# Patient Record
Sex: Male | Born: 1950 | Race: Black or African American | Hispanic: No | Marital: Single | Smoking: Never smoker
Health system: Southern US, Community
[De-identification: ages and names within clinical notes are randomized; demographics above are authoritative.]

## PROBLEM LIST (undated history)

## (undated) DIAGNOSIS — I10 Essential (primary) hypertension: Secondary | ICD-10-CM

## (undated) HISTORY — PX: FOOT SURGERY: SHX648

## (undated) HISTORY — DX: Essential (primary) hypertension: I10

---

## 2012-07-07 ENCOUNTER — Ambulatory Visit: Payer: 59 | Admitting: Emergency Medicine

## 2012-07-07 ENCOUNTER — Ambulatory Visit: Payer: 59

## 2012-07-07 VITALS — BP 165/96 | HR 69 | Temp 97.6°F | Resp 18 | Ht 70.0 in | Wt 203.0 lb

## 2012-07-07 DIAGNOSIS — M25579 Pain in unspecified ankle and joints of unspecified foot: Secondary | ICD-10-CM

## 2012-07-07 DIAGNOSIS — S92213A Displaced fracture of cuboid bone of unspecified foot, initial encounter for closed fracture: Secondary | ICD-10-CM

## 2012-07-07 MED ORDER — HYDROCODONE-ACETAMINOPHEN 5-500 MG PO TABS: 1.0000 | ORAL_TABLET | ORAL | Status: AC | PRN

## 2012-07-07 NOTE — Progress Notes (Signed)
[facility] [address] [place] [address] [phone]  Date:  07/07/2012   Name:  Gerald Fisher   DOB:  1951-06-06   MRN:  [id number]  PCP:  No primary provider on file.    Chief Complaint: Ankle Pain   History of Present Illness:  Gerald Fisher is a 61 y.o. very pleasant male patient who presents with the following:  Blowing leaves week before thanksgiving on an embankment and twisted his ankle.  Has persistent pain in lateral ankle and pain with weight bearing.  No deformity or ecchymosis.  Pain with inversion  There is no problem list on file for this patient.   History reviewed. No pertinent past medical history.  Past Surgical History  Procedure Date  . Foot surgery     History  Substance Use Topics  . Smoking status: Never Smoker   . Smokeless tobacco: Not on file  . Alcohol Use: Yes    History reviewed. No pertinent family history.  No Known Allergies  Medication list has been reviewed and updated.  No current outpatient prescriptions on file prior to visit.    Review of Systems:  As per HPI, otherwise negative.    Physical Examination: Filed Vitals:   07/07/12 1450  BP: 165/96  Pulse: 69  Temp: 97.6 F (36.4 C)  Resp: 18   Filed Vitals:   07/07/12 1450  Height: 5\' 10"  (1.778 m)  Weight: 203 lb (92.08 kg)   Body mass index is 29.13 kg/(m^2). Ideal Body Weight: Weight in (lb) to have BMI = 25: 173.9    GEN: WDWN, NAD, Non-toxic, Alert & Oriented x 3 HEENT: Atraumatic, Normocephalic.  Ears and Nose: No external deformity. EXTR: No clubbing/cyanosis/edema NEURO: Normal gait.  PSYCH: Normally interactive. Conversant. Not depressed or anxious appearing.  Calm demeanor.  Left ankle:  Tender swollen lateral ankle.  No deformity pain with inversion.  No ecchymosis.  Assessment and Plan: Cuboid fracture Boot Anaprox Elevate Ice Refer ortho  Carmelina Dane, MD  [facility] reading (PRIMARY) by  Dr.  Dareen Piano.  Ankle negative.  [facility] reading (PRIMARY) by  Dr. Dareen Piano.  Foot:  Fracture cuboid.

## 2012-07-08 ENCOUNTER — Other Ambulatory Visit: Payer: Self-pay | Admitting: Radiology

## 2012-07-11 NOTE — Progress Notes (Signed)
Reviewed and agree.

## 2012-12-03 ENCOUNTER — Ambulatory Visit: Payer: 59

## 2012-12-03 ENCOUNTER — Ambulatory Visit: Payer: 59 | Admitting: Internal Medicine

## 2012-12-03 VITALS — BP 142/84 | HR 58 | Temp 98.0°F | Resp 17 | Ht 69.0 in | Wt 203.0 lb

## 2012-12-03 DIAGNOSIS — M79673 Pain in unspecified foot: Secondary | ICD-10-CM

## 2012-12-03 DIAGNOSIS — M79609 Pain in unspecified limb: Secondary | ICD-10-CM

## 2012-12-03 DIAGNOSIS — M79675 Pain in left toe(s): Secondary | ICD-10-CM

## 2012-12-03 LAB — COMPREHENSIVE METABOLIC PANEL
CO2: 28 mEq/L (ref 19–32)
Calcium: 9.4 mg/dL (ref 8.4–10.5)
Chloride: 107 mEq/L (ref 96–112)
Glucose, Bld: 108 mg/dL — ABNORMAL HIGH (ref 70–99)
Sodium: 142 mEq/L (ref 135–145)
Total Bilirubin: 0.4 mg/dL (ref 0.3–1.2)
Total Protein: 7.2 g/dL (ref 6.0–8.3)

## 2012-12-03 LAB — POCT CBC
HCT, POC: 46.9 % (ref 43.5–53.7)
Lymph, poc: 2.3 (ref 0.6–3.4)
MCHC: 31.6 g/dL — AB (ref 31.8–35.4)
MCV: 98.2 fL — AB (ref 80–97)
POC LYMPH PERCENT: 41.6 %L (ref 10–50)
RDW, POC: 13.7 %

## 2012-12-03 MED ORDER — INDOMETHACIN 50 MG PO CAPS: 50.0000 mg | ORAL_CAPSULE | Freq: Three times a day (TID) | ORAL | Status: DC

## 2012-12-03 NOTE — Progress Notes (Signed)
  Subjective:    Patient ID: Gerald Fisher, male    DOB: 08-22-1950, 62 y.o.   MRN: [id number]  HPI Patient came in today for pain in both feet.  Patient states he is using ibuprofen for pain.  Patient's left foot is swollen.   Patient denies any injury.  Patient states he has had this in the past but when it gets well, he tends to go back to poor diet habbits.  Patient states that it has been painful for about a week. He calls it gout , uric acid from 2006 4.9. No hx of aspirated joint. No regular md or ckups..   Review of Systems     Objective:   Physical Exam  Constitutional: He is oriented to person, place, and time. He appears well-developed and well-nourished. No distress.  Eyes: EOM are normal.  Cardiovascular: Normal rate and normal heart sounds.   Pulmonary/Chest: Effort normal.  Musculoskeletal: He exhibits edema and tenderness.  Neurological: He is alert and oriented to person, place, and time. He has normal strength. No cranial nerve deficit or sensory deficit. He exhibits normal muscle tone. Coordination and gait abnormal.  Skin: Skin is warm. No rash noted. No erythema.      Results for orders placed in visit on 12/03/12  POCT CBC      Result Value Range   WBC 5.5  4.6 - 10.2 K/uL   Lymph, poc 2.3  0.6 - 3.4   POC LYMPH PERCENT 41.6  10 - 50 %L   MID (cbc) 0.7  0 - 0.9   POC MID % 12.0  0 - 12 %M   POC Granulocyte 2.6  2 - 6.9   Granulocyte percent 46.4  37 - 80 %G   RBC 4.78  4.69 - 6.13 M/uL   Hemoglobin 14.8  14.1 - 18.1 g/dL   HCT, POC 40.9  81.1 - 53.7 %   MCV 98.2 (*) 80 - 97 fL   MCH, POC 31.0  27 - 31.2 pg   MCHC 31.6 (*) 31.8 - 35.4 g/dL   RDW, POC 91.4     Platelet Count, POC 280  142 - 424 K/uL   MPV 8.5  0 - 99.8 fL   [facility] reading (PRIMARY) by  Dr.Guest no fx, punched out lesion at mtpj suggestive of gout         Assessment & Plan:  Possible gout

## 2012-12-03 NOTE — Patient Instructions (Signed)
Gout Gout is an inflammatory condition (arthritis) caused by a buildup of uric acid crystals in the joints. Uric acid is a chemical that is normally present in the blood. Under some circumstances, uric acid can form into crystals in your joints. This causes joint redness, soreness, and swelling (inflammation). Repeat attacks are common. Over time, uric acid crystals can form into masses (tophi) near a joint, causing disfigurement. Gout is treatable and often preventable. CAUSES  The disease begins with elevated levels of uric acid in the blood. Uric acid is produced by your body when it breaks down a naturally found substance called purines. This also happens when you eat certain foods such as meats and fish. Causes of an elevated uric acid level include:  Being passed down from parent to child (heredity).  Diseases that cause increased uric acid production (obesity, psoriasis, some cancers).  Excessive alcohol use.  Diet, especially diets rich in meat and seafood.  Medicines, including certain cancer-fighting drugs (chemotherapy), diuretics, and aspirin.  Chronic kidney disease. The kidneys are no longer able to remove uric acid well.  Problems with metabolism. Conditions strongly associated with gout include:  Obesity.  High blood pressure.  High cholesterol.  Diabetes. Not everyone with elevated uric acid levels gets gout. It is not understood why some people get gout and others do not. Surgery, joint injury, and eating too much of certain foods are some of the factors that can lead to gout. SYMPTOMS   An attack of gout comes on quickly. It causes intense pain with redness, swelling, and warmth in a joint.  Fever can occur.  Often, only one joint is involved. Certain joints are more commonly involved:  Base of the big toe.  Knee.  Ankle.  Wrist.  Finger. Without treatment, an attack usually goes away in a few days to weeks. Between attacks, you usually will not have  symptoms, which is different from many other forms of arthritis. DIAGNOSIS  Your caregiver will suspect gout based on your symptoms and exam. Removal of fluid from the joint (arthrocentesis) is done to check for uric acid crystals. Your caregiver will give you a medicine that numbs the area (local anesthetic) and use a needle to remove joint fluid for exam. Gout is confirmed when uric acid crystals are seen in joint fluid, using a special microscope. Sometimes, blood, urine, and X-ray tests are also used. TREATMENT  There are 2 phases to gout treatment: treating the sudden onset (acute) attack and preventing attacks (prophylaxis). Treatment of an Acute Attack  Medicines are used. These include anti-inflammatory medicines or steroid medicines.  An injection of steroid medicine into the affected joint is sometimes necessary.  The painful joint is rested. Movement can worsen the arthritis.  You may use warm or cold treatments on painful joints, depending which works best for you.  Discuss the use of coffee, vitamin C, or cherries with your caregiver. These may be helpful treatment options. Treatment to Prevent Attacks After the acute attack subsides, your caregiver may advise prophylactic medicine. These medicines either help your kidneys eliminate uric acid from your body or decrease your uric acid production. You may need to stay on these medicines for a very long time. The early phase of treatment with prophylactic medicine can be associated with an increase in acute gout attacks. For this reason, during the first few months of treatment, your caregiver may also advise you to take medicines usually used for acute gout treatment. Be sure you understand your caregiver's directions.   You should also discuss dietary treatment with your caregiver. Certain foods such as meats and fish can increase uric acid levels. Other foods such as dairy can decrease levels. Your caregiver can give you a list of foods  to avoid. HOME CARE INSTRUCTIONS   Do not take aspirin to relieve pain. This raises uric acid levels.  Only take over-the-counter or prescription medicines for pain, discomfort, or fever as directed by your caregiver.  Rest the joint as much as possible. When in bed, keep sheets and blankets off painful areas.  Keep the affected joint raised (elevated).  Use crutches if the painful joint is in your leg.  Drink enough water and fluids to keep your urine clear or pale yellow. This helps your body get rid of uric acid. Do not drink alcoholic beverages. They slow the passage of uric acid.  Follow your caregiver's dietary instructions. Pay careful attention to the amount of protein you eat. Your daily diet should emphasize fruits, vegetables, whole grains, and fat-free or low-fat milk products.  Maintain a healthy body weight. SEEK MEDICAL CARE IF:   You have an oral temperature above 102 F (38.9 C).  You develop diarrhea, vomiting, or any side effects from medicines.  You do not feel better in 24 hours, or you are getting worse. SEEK IMMEDIATE MEDICAL CARE IF:   Your joint becomes suddenly more tender and you have:  Chills.  An oral temperature above 102 F (38.9 C), not controlled by medicine. MAKE SURE YOU:   Understand these instructions.  Will watch your condition.  Will get help right away if you are not doing well or get worse. Document Released: 07/17/2000 Document Revised: 10/12/2011 Document Reviewed: 10/28/2009 ExitCare Patient Information 2013 [employer].    

## 2012-12-03 NOTE — Progress Notes (Signed)
  Subjective:    Patient ID: Gerald Fisher, male    DOB: [birth year], 62 y.o.   MRN: [id number]  HPI    Review of Systems     Objective:   Physical Exam        Assessment & Plan:

## 2013-11-06 IMAGING — CR DG FOOT COMPLETE 3+V*L*
3 series · 3 of 3 positions shown · non-contrast
Comparison: None.

CLINICAL DATA: Twisted ankle

LEFT FOOT - COMPLETE 3+ VIEW

[AP]
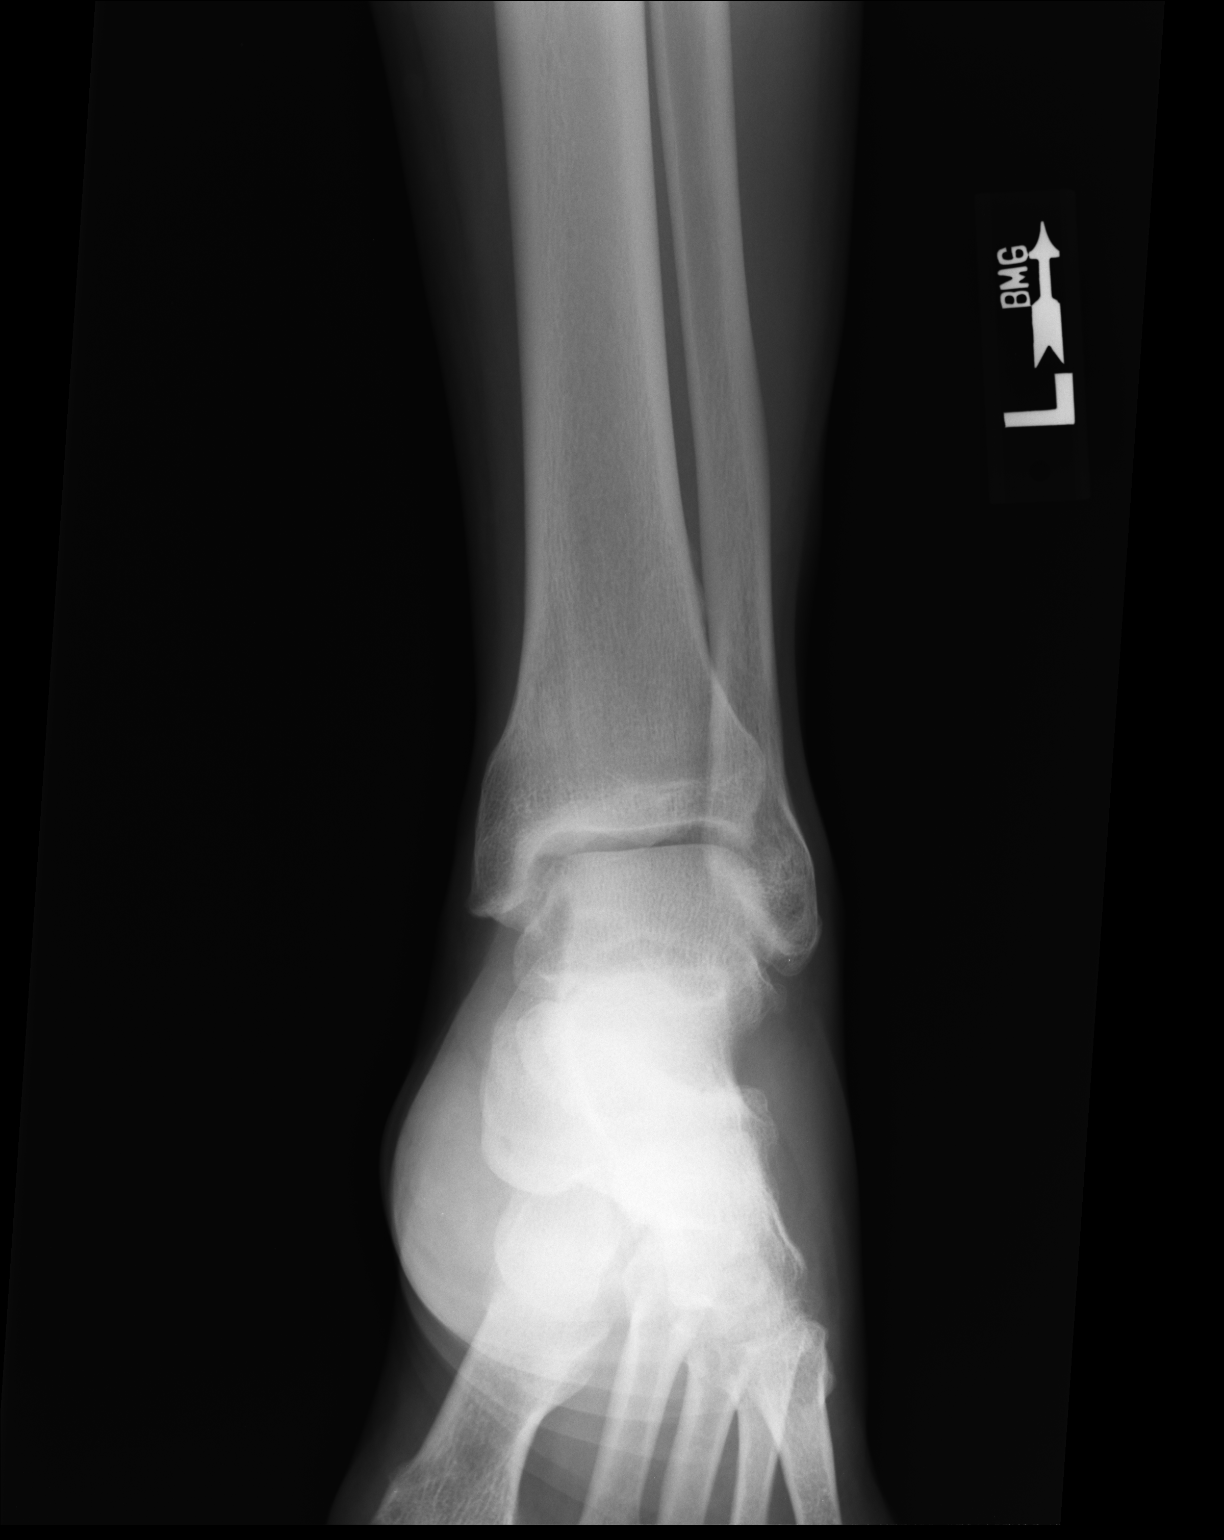

[ap obl int rot]
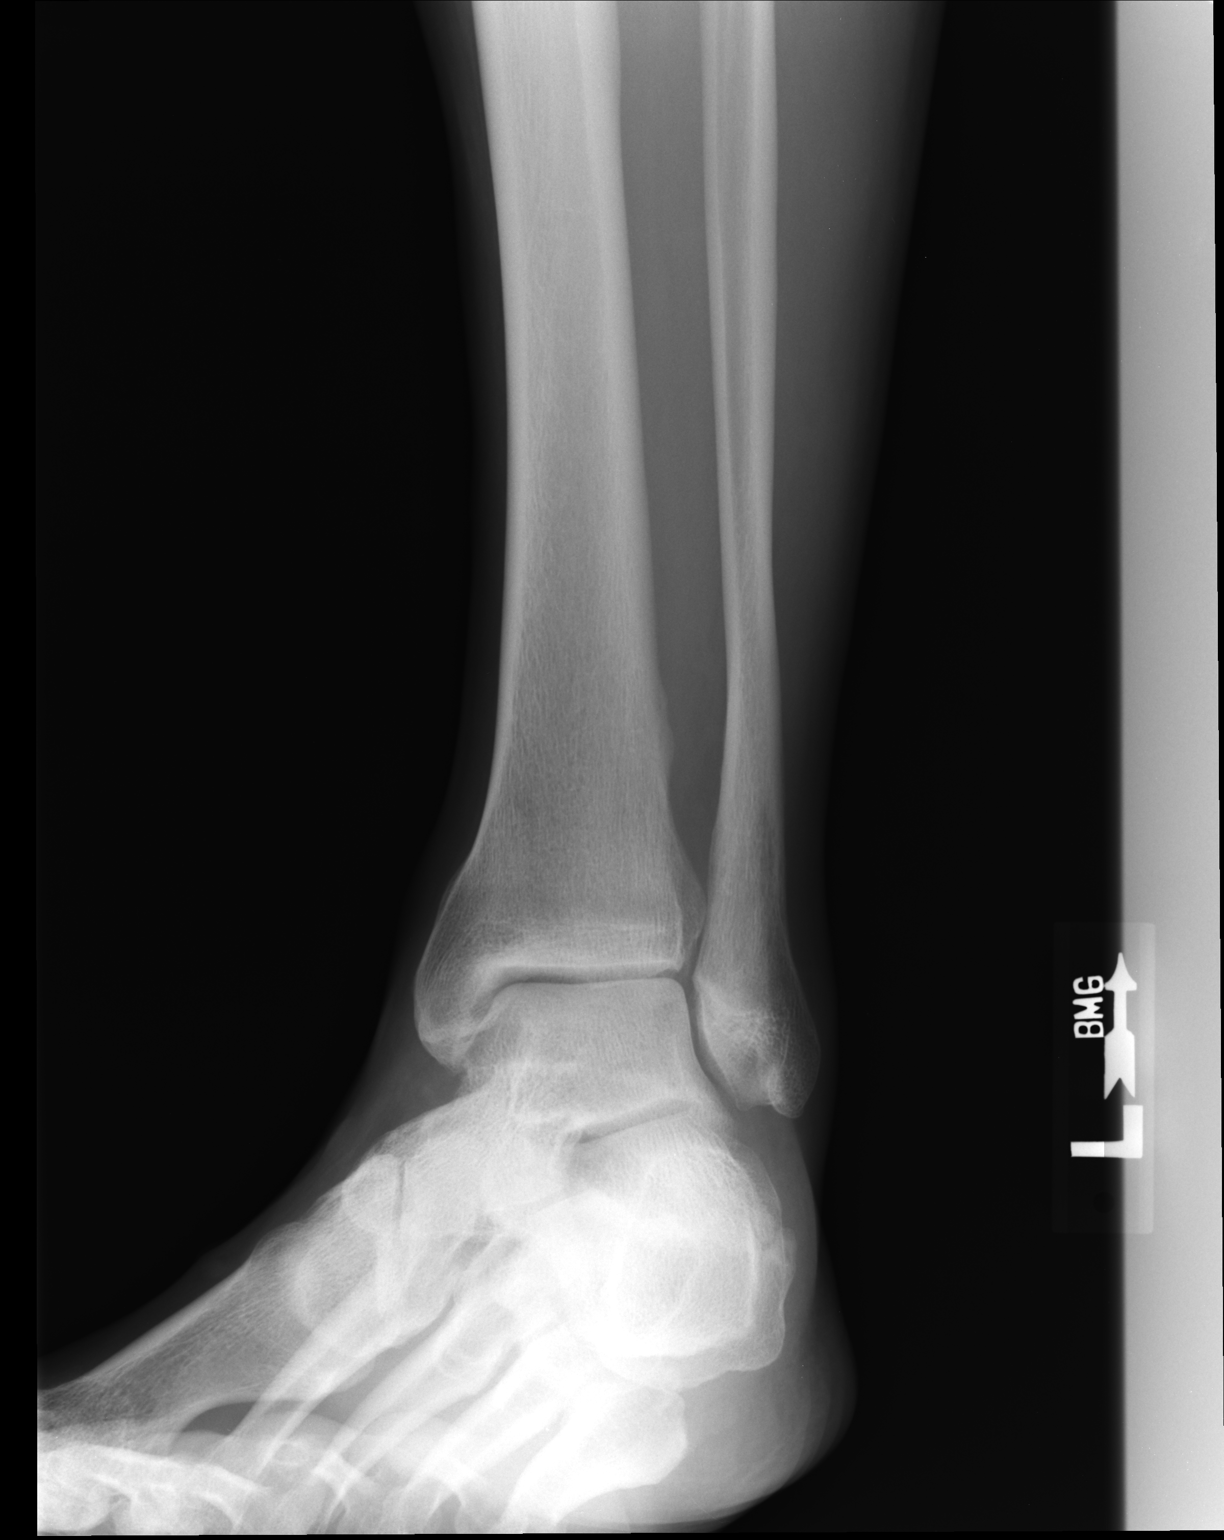

[lateral]
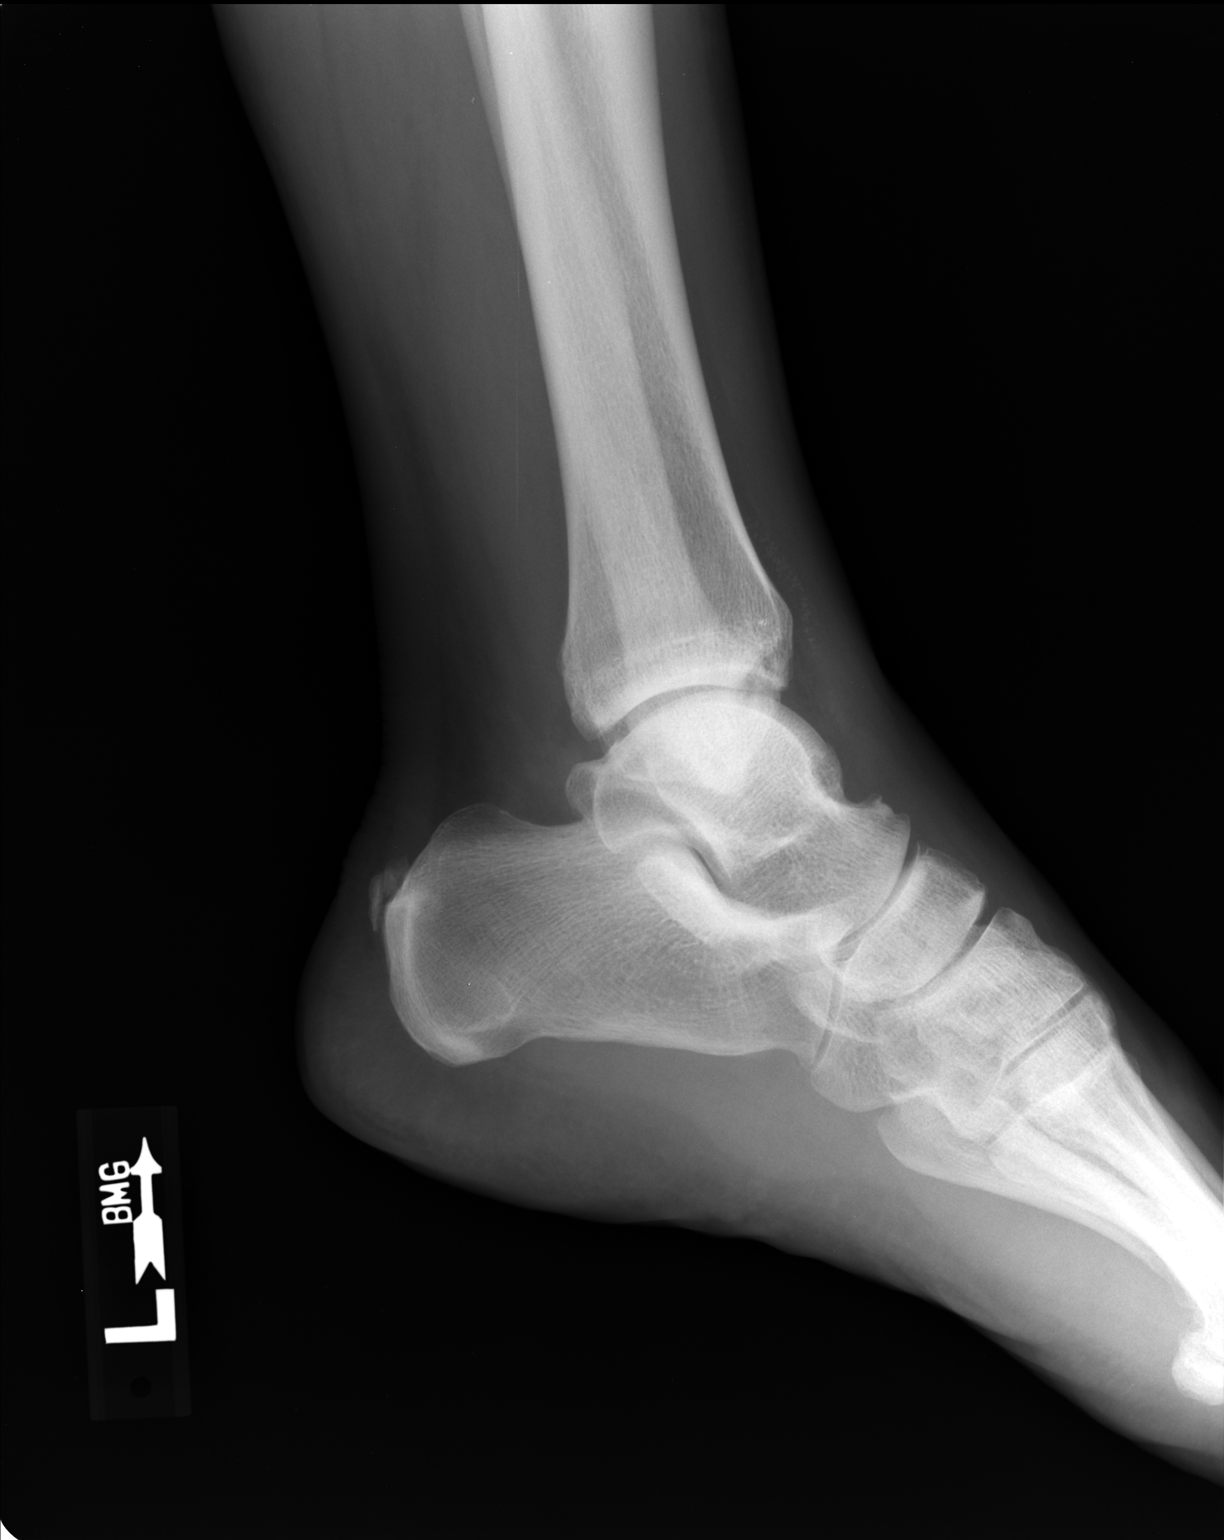

[3 of 3 positions shown; findings below may reference images not displayed]

FINDINGS: Negative for fracture.  Mild degenerative change in the
first metatarsal phalangeal joint.  No periosteal reaction or
erosion.  Spurring of the calcaneus.
IMPRESSION: Negative for fracture.

## 2014-04-04 IMAGING — CR DG TOE GREAT 2+V*L*
1 series · 1 of 1 positions shown · non-contrast
Comparison: 07/07/2012

CLINICAL DATA: Great toe pain and swelling

LEFT GREAT TOE

[AP]
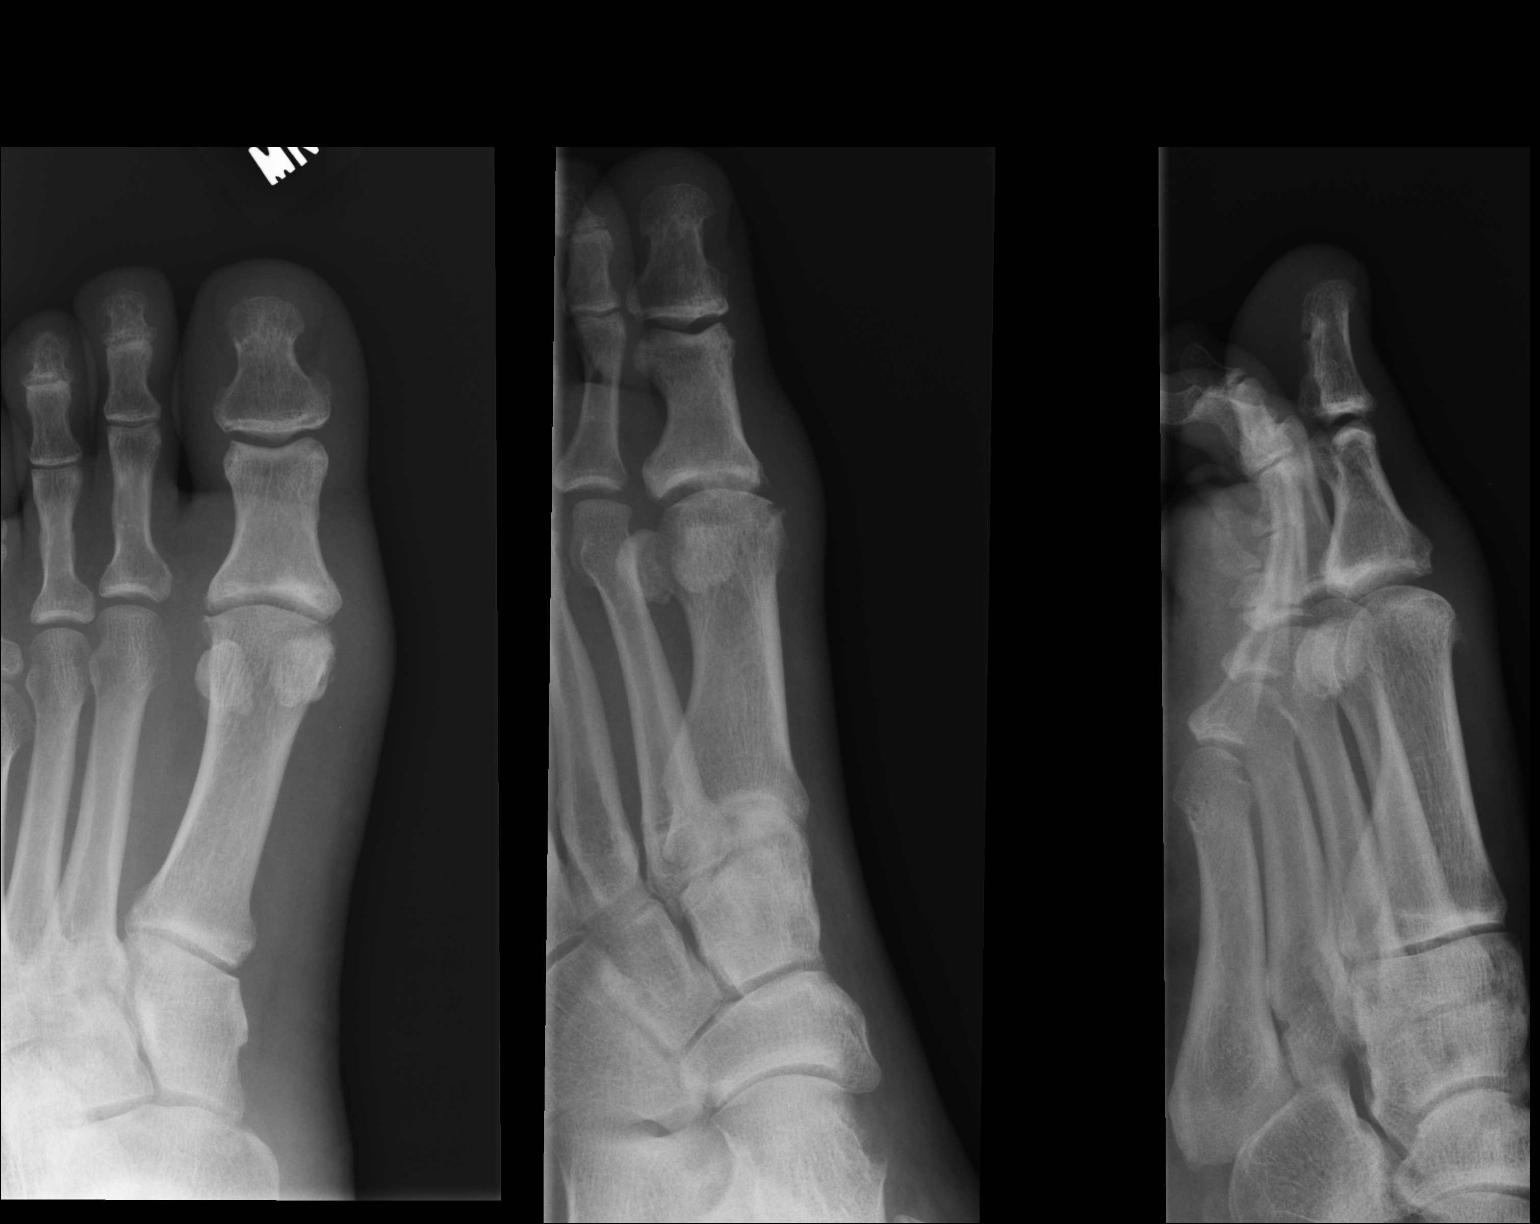

[1 of 1 positions shown; findings below may reference images not displayed]

FINDINGS: Normal alignment.  No fracture.  Left first MTP joint
degenerative change with sclerosis, bony spurring and possibly a
small early erosion of the left first metatarsal head.  This can be
seen with erosive osteoarthritis versus gout arthritis.  Overall
the appearance is similar to 07/07/2012
IMPRESSION: No acute osseous finding.

Left first MTP joint degenerative arthritis.

## 2015-05-24 ENCOUNTER — Ambulatory Visit (INDEPENDENT_AMBULATORY_CARE_PROVIDER_SITE_OTHER): Payer: MEDICARE | Admitting: Family Medicine

## 2015-05-24 VITALS — BP 160/90 | HR 73 | Temp 97.8°F | Resp 16 | Ht 69.0 in | Wt 198.8 lb

## 2015-05-24 DIAGNOSIS — M79675 Pain in left toe(s): Secondary | ICD-10-CM

## 2015-05-24 DIAGNOSIS — M79673 Pain in unspecified foot: Secondary | ICD-10-CM | POA: Diagnosis not present

## 2015-05-24 DIAGNOSIS — M10071 Idiopathic gout, right ankle and foot: Secondary | ICD-10-CM | POA: Insufficient documentation

## 2015-05-24 DIAGNOSIS — I10 Essential (primary) hypertension: Secondary | ICD-10-CM

## 2015-05-24 LAB — CBC
HCT: 43.1 % (ref 39.0–52.0)
Hemoglobin: 15.1 g/dL (ref 13.0–17.0)
MCH: 32.3 pg (ref 26.0–34.0)
MCHC: 35 g/dL (ref 30.0–36.0)
MCV: 92.1 fL (ref 78.0–100.0)
MPV: 10.5 fL (ref 8.6–12.4)
PLATELETS: 212 10*3/uL (ref 150–400)
RBC: 4.68 MIL/uL (ref 4.22–5.81)
RDW: 12.6 % (ref 11.5–15.5)
WBC: 4.4 10*3/uL (ref 4.0–10.5)

## 2015-05-24 LAB — COMPREHENSIVE METABOLIC PANEL
ALK PHOS: 63 U/L (ref 40–115)
ALT: 13 U/L (ref 9–46)
AST: 11 U/L (ref 10–35)
Albumin: 3.6 g/dL (ref 3.6–5.1)
BUN: 16 mg/dL (ref 7–25)
CO2: 28 mmol/L (ref 20–31)
CREATININE: 1.21 mg/dL (ref 0.70–1.25)
Calcium: 8.9 mg/dL (ref 8.6–10.3)
Chloride: 104 mmol/L (ref 98–110)
GLUCOSE: 86 mg/dL (ref 65–99)
POTASSIUM: 4 mmol/L (ref 3.5–5.3)
SODIUM: 139 mmol/L (ref 135–146)
TOTAL PROTEIN: 6.5 g/dL (ref 6.1–8.1)
Total Bilirubin: 0.6 mg/dL (ref 0.2–1.2)

## 2015-05-24 MED ORDER — COLCHICINE 0.6 MG PO TABS: ORAL_TABLET | ORAL | Status: DC

## 2015-05-24 MED ORDER — INDOMETHACIN 50 MG PO CAPS: 50.0000 mg | ORAL_CAPSULE | Freq: Three times a day (TID) | ORAL | Status: DC

## 2015-05-24 MED ORDER — LISINOPRIL 10 MG PO TABS: 10.0000 mg | ORAL_TABLET | Freq: Every day | ORAL | Status: DC

## 2015-05-24 NOTE — Patient Instructions (Addendum)
Use the indomethacin as needed for gout pain (while on this do not use ibuprofen or aleve) Also use the colchicine as directed- you can repeat this if needed for future gout flares.  Let us know if you do not feel better in the next 1-2 days Ice and elevation may also help  Your blood pressure has been high the last few times we have seen you here.   We are going to start lisinopril for your high blood pressure- I will be in touch with your labs Please come and see [place] in a couple of months for a full physical

## 2015-05-24 NOTE — Progress Notes (Signed)
[facility] [address], [place] [address] [phone]  Date:  05/24/2015   Name:  Gerald Fisher   DOB:  08-30-1950   MRN:  [id number]  PCP:  No primary care provider on file.    Chief Complaint: Foot Pain   History of Present Illness:  Gerald Fisher is a 64 y.o. very pleasant male patient who presents with the following:  He has a history of gout- noted a flare up in the right foot over the last 2-3 days.  This is the same joint where he has had gout in the past.  He has very occasional gout.  Last flare was a couple of years ago and responded to treatment  He has not had any injury to the foot  He is not taking indomethacin- this was an rx from long ago He has tried ibuprofen  Noted that his BP is running high- he has been aware of elevated BP in the past but has never been treated He does not generally see doctors or have check-ups He is ok with starting BP medication now There are no active problems to display for this patient.   History reviewed. No pertinent past medical history.  Past Surgical History  Procedure Laterality Date  . Foot surgery      Social History  Substance Use Topics  . Smoking status: Never Smoker   . Smokeless tobacco: None  . Alcohol Use: Yes    History reviewed. No pertinent family history.  No Known Allergies  Medication list has been reviewed and updated.  Current Outpatient Prescriptions on File Prior to Visit  Medication Sig Dispense Refill  . indomethacin (INDOCIN) 50 MG capsule Take 1 capsule (50 mg total) by mouth 3 (three) times daily with meals. (Patient not taking: Reported on 05/24/2015) 30 capsule 1   No current facility-administered medications on file prior to visit.    Review of Systems:  As per HPI- otherwise negative.  BP Readings from Last 3 Encounters:  05/24/15 162/88  12/03/12 142/84  07/07/12 165/96    Physical Examination: Filed Vitals:   05/24/15 1241  BP: 162/88   Pulse: 73  Temp: 97.8 F (36.6 C)  Resp: 16   Filed Vitals:   05/24/15 1241  Height: 5\' 9"  (1.753 m)  Weight: 198 lb 12.8 oz (90.175 kg)   Body mass index is 29.34 kg/(m^2). Ideal Body Weight: Weight in (lb) to have BMI = 25: 168.9  GEN: WDWN, NAD, Non-toxic, A & O x 3, mild overweight HEENT: Atraumatic, Normocephalic. Neck supple. No masses, No LAD. Ears and Nose: No external deformity. CV: RRR, No M/G/R. No JVD. No thrill. No extra heart sounds. PULM: CTA B, no wheezes, crackles, rhonchi. No retractions. No resp. distress. No accessory muscle use. EXTR: No c/c/e NEURO favoring right slightly, wearing a bedroom slipper on the right PSYCH: Normally interactive. Conversant. Not depressed or anxious appearing.  Calm demeanor.  Right foot; he has tenderness, mild redness and swelling at the right 1st MCP and 2nd MCP  Assessment and Plan: Acute idiopathic gout of right foot  Foot pain, unspecified laterality - Plan: indomethacin (INDOCIN) 50 MG capsule  Great toe pain, left - Plan: colchicine 0.6 MG tablet, indomethacin (INDOCIN) 50 MG capsule  Essential hypertension - Plan: CBC, Comprehensive metabolic panel, lisinopril (PRINIVIL,ZESTRIL) 10 MG tablet  Treat for gout with indomethacin and colchicine See patient instructions for more details.   Advised regarding follow-up care He has very  infrequent gout flares so prophylactic medication likely not needed Start on lisinopril 10 mg and await labs  Plan for follow-up and CPE in 1-2 months   Signed Lamar Blinks, MD

## 2015-05-25 ENCOUNTER — Encounter: Payer: Self-pay | Admitting: Family Medicine

## 2015-06-05 ENCOUNTER — Ambulatory Visit (INDEPENDENT_AMBULATORY_CARE_PROVIDER_SITE_OTHER): Payer: MEDICARE | Admitting: Family Medicine

## 2015-06-05 ENCOUNTER — Ambulatory Visit (INDEPENDENT_AMBULATORY_CARE_PROVIDER_SITE_OTHER): Payer: MEDICARE

## 2015-06-05 VITALS — BP 146/84 | HR 75 | Temp 98.3°F | Resp 16 | Ht 69.75 in | Wt 197.0 lb

## 2015-06-05 DIAGNOSIS — M1990 Unspecified osteoarthritis, unspecified site: Secondary | ICD-10-CM

## 2015-06-05 DIAGNOSIS — M1000 Idiopathic gout, unspecified site: Secondary | ICD-10-CM

## 2015-06-05 DIAGNOSIS — M19079 Primary osteoarthritis, unspecified ankle and foot: Secondary | ICD-10-CM

## 2015-06-05 DIAGNOSIS — M109 Gout, unspecified: Secondary | ICD-10-CM

## 2015-06-05 DIAGNOSIS — M10071 Idiopathic gout, right ankle and foot: Secondary | ICD-10-CM

## 2015-06-05 MED ORDER — PREDNISONE 10 MG PO TABS: ORAL_TABLET | ORAL | Status: DC

## 2015-06-05 NOTE — Patient Instructions (Signed)
Start the lisinopril for blood pressure once a day every day. Take the 6 day prednisone taper - do not take any other pain medicines other than tylenol (acetaminophen) while you are on the prednisone.  Take all the tabs together in the morning. After that, anytime your toe is hurting you, you can take an indomethacin - this works well for regular arthritis and for gout - we don't want you to stay on it all the time as it can cause heartburn, indigestion, acid stomach and can be hard on your kidneys, blood pressure, and heart. However, it is ok to take occasionally for pain or to use three times a day for just several days in times of more severe joint pains. If you have symptoms completely typical of gout - meaning when just 1 joint such as the toe swells up, becomes warm, red, and VERY tender (like your sock, shoe, or the weight of the bedding is to painful to your toe to tolerate), then go ahead and take 2 tabs of colchicine and another tab 1 hr later.  This should largely eliminate that gout flair and then for the next few days you can just stay on the indomethacin with breakfast, lunch, and dinner for 2-3 days until pain is completely gone and back to normal. We will want to recheck your kidneys and make sure your stomach is doing well in the next few months since starting the BP med so make an appointment for your welcome to medicare physical now!  Osteoarthritis Osteoarthritis is a disease that causes soreness and inflammation of a joint. It occurs when the cartilage at the affected joint wears down. Cartilage acts as a cushion, covering the ends of bones where they meet to form a joint. Osteoarthritis is the most common form of arthritis. It often occurs in older people. The joints affected most often by this condition include those in the:  Ends of the fingers.  Thumbs.  Neck.  Lower back.  Knees.  Hips. CAUSES  Over time, the cartilage that covers the ends of bones begins to wear away.  This causes bone to rub on bone, producing pain and stiffness in the affected joints.  RISK FACTORS Certain factors can increase your chances of having osteoarthritis, including:  Older age.  Excessive body weight.  Overuse of joints.  Previous joint injury. SIGNS AND SYMPTOMS   Pain, swelling, and stiffness in the joint.  Over time, the joint may lose its normal shape.  Small deposits of bone (osteophytes) may grow on the edges of the joint.  Bits of bone or cartilage can break off and float inside the joint space. This may cause more pain and damage. DIAGNOSIS  Your health care provider will do a physical exam and ask about your symptoms. Various tests may be ordered, such as:  X-rays of the affected joint.  Blood tests to rule out other types of arthritis. Additional tests may be used to diagnose your condition. TREATMENT  Goals of treatment are to control pain and improve joint function. Treatment plans may include:  A prescribed exercise program that allows for rest and joint relief.  A weight control plan.  Pain relief techniques, such as:  Properly applied heat and cold.  Electric pulses delivered to nerve endings under the skin (transcutaneous electrical nerve stimulation [TENS]).  Massage.  Certain nutritional supplements.  Medicines to control pain, such as:  Acetaminophen.  Nonsteroidal anti-inflammatory drugs (NSAIDs), such as naproxen.  Narcotic or central-acting agents, such as  tramadol.  Corticosteroids. These can be given orally or as an injection.  Surgery to reposition the bones and relieve pain (osteotomy) or to remove loose pieces of bone and cartilage. Joint replacement may be needed in advanced states of osteoarthritis. HOME CARE INSTRUCTIONS   Take medicines only as directed by your health care provider.  Maintain a healthy weight. Follow your health care provider's instructions for weight control. This may include dietary  instructions.  Exercise as directed. Your health care provider can recommend specific types of exercise. These may include:  Strengthening exercises. These are done to strengthen the muscles that support joints affected by arthritis. They can be performed with weights or with exercise bands to add resistance.  Aerobic activities. These are exercises, such as brisk walking or low-impact aerobics, that get your heart pumping.  Range-of-motion activities. These keep your joints limber.  Balance and agility exercises. These help you maintain daily living skills.  Rest your affected joints as directed by your health care provider.  Keep all follow-up visits as directed by your health care provider. SEEK MEDICAL CARE IF:   Your skin turns red.  You develop a rash in addition to your joint pain.  You have worsening joint pain.  You have a fever along with joint or muscle aches. SEEK IMMEDIATE MEDICAL CARE IF:  You have a significant loss of weight or appetite.  You have night sweats. [employer] of Arthritis and Musculoskeletal and Skin Diseases: www.niams.SouthExposed.es  [employer] on Aging: http://kim-miller.com/  American College of Rheumatology: www.rheumatology.org   This information is not intended to replace advice given to you by your health care provider. Make sure you discuss any questions you have with your health care provider.   Document Released: 07/20/2005 Document Revised: 08/10/2014 Document Reviewed: 03/27/2013 Elsevier Interactive Patient Education 2016 [employer].  There are some easy diet adjustments that you can make to lower your blood uric acid level and thereby hopefully never have to suffer from another gout flair (or at least less frequently).  You should avoid alcohol, drink plenty of water, and try to follow a "low purine" diet as your body produces uric acid when it breaks down prurines-substances that are found naturally in  your body, as well as in certain foods such as organ meats, anchioves, herring, asparagus, and mushrooms. Also, increasing your diet in certain foods that may lower uric acid levels is a pretty safe way to decrease your likelihood of gout so consider drinking coffee (regular and/or decaf), eating fruits with Vitamin C in them such as citrus fruits, strawberries, broccoli,  brussel sprouts, papaya, and cantaloupe (though megadoses of vitamin C supplements may do the opposite and increase your body's uric acid levels), and/or eating more cherries and other dark-colored fruits, such as blackberries, blueberries, purple grapes and raspberries. In addition, getting plenty of vitamin A though yellow fruits, or dark green/yellow vegetables at least every other day is good.  Other general diet guidelines for people with gout who need to lower their blood uric acid levels are as follows:  Drink 8 to 16 cups ( about 2 to 4 liters) of fluid each day, with at least half being water. Eat a moderate amount of protein, preferably from healthy sources, such as low-fat or fat-free dairy, tofu, eggs, and nut butters. Limit your daily intake of meat, fish, and poultry to 4 to 6 ounces. Avoid high fat meats and desserts. Decrease your intake of shellfish, beef, lamb, pork, eggs  and cheese. Avoid drastic weight reduction or fasting.  If weight loss is desired lose it over a period of several months.

## 2015-06-05 NOTE — Progress Notes (Addendum)
Subjective:    Patient ID: Gerald Fisher, male    DOB: 09-15-1950, 64 y.o.   MRN: [id number] This chart was scribed for Delman Cheadle, MD by Zola Button, Medical Scribe. This patient was seen in Room 5 and the patient's care was started at 4:47 PM.   Chief Complaint  Patient presents with  . Gout    right foot    HPI HPI Comments: Gerald Fisher is a 64 y.o. male who presents to the [facility] for a follow-up for gout. He was seen 2 weeks ago by Dr. Lorelei Pont. Started patient on indomethacin for right foot gout as well as colchicine. He also started lisinopril for blood pressure. CBC and CMP were normal. Blood pressure significantly improved. Today, patient notes that the swelling had improved significantly with the medications, but notes that the his symptoms returned 3 days ago after he had been more physically active. He had been taking all of his medications, including the indomethacin 3 times a day, but discontinued all of his medications because he began having diarrhea and was unsure which medication was causing the diarrhea. He then restarted the colchicine again but it did not seem to be effective. Patient had a foot surgery done on his right 1st toe over 10 years ago, which was done locally.  History reviewed. No pertinent past medical history. Past Surgical History  Procedure Laterality Date  . Foot surgery     Current Outpatient Prescriptions on File Prior to Visit  Medication Sig Dispense Refill  . colchicine 0.6 MG tablet Take 2 pills once and then 1 pill an hour later. Repeat as needed for gout flare (Patient not taking: Reported on 06/15/2015) 30 tablet 0  . indomethacin (INDOCIN) 50 MG capsule Take 1 capsule (50 mg total) by mouth 3 (three) times daily with meals. (Patient not taking: Reported on 06/15/2015) 30 capsule 1  . lisinopril (PRINIVIL,ZESTRIL) 10 MG tablet Take 1 tablet (10 mg total) by mouth daily. 90 tablet 3   No current facility-administered  medications on file prior to visit.   No Known Allergies History reviewed. No pertinent family history. Social History   Social History  . Marital Status: Single    Spouse Name: N/A  . Number of Children: N/A  . Years of Education: N/A   Social History Main Topics  . Smoking status: Never Smoker   . Smokeless tobacco: None  . Alcohol Use: Yes  . Drug Use: No  . Sexual Activity: Yes    Birth Control/ Protection: Condom   Other Topics Concern  . None   Social History Narrative    Review of Systems  Constitutional: Positive for activity change. Negative for fever, chills, diaphoresis, appetite change and unexpected weight change.  Eyes: Negative for visual disturbance.  Respiratory: Negative for shortness of breath.   Cardiovascular: Positive for leg swelling.  Gastrointestinal: Positive for diarrhea. Negative for nausea, vomiting and abdominal pain.  Musculoskeletal: Positive for myalgias, joint swelling, arthralgias and gait problem.  Skin: Positive for color change. Negative for pallor and wound.  Neurological: Negative for weakness, numbness and headaches.  Hematological: Does not bruise/bleed easily.  Psychiatric/Behavioral: Positive for sleep disturbance.       Objective:  BP 146/84 mmHg  Pulse 75  Temp(Src) 98.3 F (36.8 C) (Oral)  Resp 16  Ht 5' 9.75" (1.772 m)  Wt 197 lb (89.359 kg)  BMI 28.46 kg/m2  SpO2 99%  Physical Exam  Constitutional: He is oriented to  person, place, and time. He appears well-developed and well-nourished. No distress.  HENT:  Head: Normocephalic and atraumatic.  Mouth/Throat: Oropharynx is clear and moist. No oropharyngeal exudate.  Eyes: Pupils are equal, round, and reactive to light.  Neck: Neck supple.  Cardiovascular: Normal rate.   Pulses:      Dorsalis pedis pulses are 2+ on the right side.       Posterior tibial pulses are 2+ on the right side.  Pulmonary/Chest: Effort normal.  Musculoskeletal: He exhibits no edema.    No lower extremity edema.  Neurological: He is alert and oriented to person, place, and time. No cranial nerve deficit.  Skin: Skin is warm and dry. No rash noted. No erythema.  No warmth. Hyperpigmentation over the dorsal aspect of the right 1st MTP with 1+ non-pitting edema around MTP.  Psychiatric: He has a normal mood and affect. His behavior is normal.  Nursing note and vitals reviewed.    [facility] reading (PRIMARY) by  Dr. Brigitte Pulse. Right foot: prior surgical pins seem to be in appropriate location. 1st MTP arthritis Assessment & Plan:   1. Acute idiopathic gout of right foot   2. Podagra   3. Arthritis of first MTP joint     Orders Placed This Encounter  Procedures  . DG Foot 2 Views Right    Standing Status: Future     Number of Occurrences: 1     Standing Expiration Date: 06/04/2016    Order Specific Question:  Reason for Exam (SYMPTOM  OR DIAGNOSIS REQUIRED)    Answer:  first MTP - s/p surgery    Order Specific Question:  Preferred imaging location?    Answer:  External    Meds ordered this encounter  Medications  .  predniSONE (DELTASONE) 10 MG tablet    Sig: 6-5-4-3-2-1 tabs po qd    Dispense:  21 tablet    Refill:  0    I personally performed the services described in this documentation, which was scribed in my presence. The recorded information has been reviewed and considered, and addended by me as needed.  Delman Cheadle, MD MPH   By signing my name below, I, Zola Button, attest that this documentation has been prepared under the direction and in the presence of Delman Cheadle, MD.  Electronically Signed: Zola Button, Medical Scribe. 06/05/2015. 4:52 PM.

## 2015-06-14 ENCOUNTER — Other Ambulatory Visit: Payer: Self-pay | Admitting: Family Medicine

## 2015-06-15 ENCOUNTER — Ambulatory Visit (INDEPENDENT_AMBULATORY_CARE_PROVIDER_SITE_OTHER): Payer: MEDICARE | Admitting: Family Medicine

## 2015-06-15 VITALS — BP 140/88 | HR 63 | Temp 97.5°F | Resp 16 | Ht 69.75 in | Wt 198.2 lb

## 2015-06-15 DIAGNOSIS — M10071 Idiopathic gout, right ankle and foot: Secondary | ICD-10-CM | POA: Diagnosis not present

## 2015-06-15 LAB — SEDIMENTATION RATE: SED RATE: 8 mm/h (ref 0–20)

## 2015-06-15 LAB — URIC ACID: Uric Acid, Serum: 6.1 mg/dL (ref 4.0–7.8)

## 2015-06-15 MED ORDER — ALLOPURINOL 300 MG PO TABS: 300.0000 mg | ORAL_TABLET | Freq: Every day | ORAL | Status: DC

## 2015-06-15 MED ORDER — PREDNISONE 20 MG PO TABS: ORAL_TABLET | ORAL | Status: DC

## 2015-06-15 NOTE — Progress Notes (Signed)
Subjective:  This chart was scribed for Delman Cheadle, MD by [facility], medical scribe at [facility].The patient was seen in exam room 07 and the patient's care was started at 11:44 AM.    Patient ID: Gerald Fisher, male    DOB: 1950/11/10, 64 y.o.   MRN: [id number] Chief Complaint  Patient presents with  . Follow-up    gout   HPI HPI Comments: Gerald Fisher is a 64 y.o. male who presents to [facility] for a gout flare up. Diagnosed with gout and prescribed indomethacin and colchicine. He was confused on how to use them. Also had prior hx of surgery on his first MTP and unaware he had pins still in place. X-ray was done and suspected with was a flare of osteoarthritis instead of gout so he was placed on a six day prednisone taper.  He did well on the prednisone taper, however his right foot began flaring up 2-3 days ago after he got off the prednisone. No major changes in his diet this past week.  History reviewed. No pertinent past medical history. Current Outpatient Prescriptions on File Prior to Visit  Medication Sig Dispense Refill  . lisinopril (PRINIVIL,ZESTRIL) 10 MG tablet Take 1 tablet (10 mg total) by mouth daily. 90 tablet 3  . colchicine 0.6 MG tablet Take 2 pills once and then 1 pill an hour later. Repeat as needed for gout flare (Patient not taking: Reported on 06/15/2015) 30 tablet 0  . indomethacin (INDOCIN) 50 MG capsule Take 1 capsule (50 mg total) by mouth 3 (three) times daily with meals. (Patient not taking: Reported on 06/15/2015) 30 capsule 1  . predniSONE (DELTASONE) 10 MG tablet 6-5-4-3-2-1 tabs po qd (Patient not taking: Reported on 06/15/2015) 21 tablet 0   No current facility-administered medications on file prior to visit.   No Known Allergies   Review of Systems  Constitutional: Negative for fever, chills, diaphoresis and appetite change.  Cardiovascular: Positive for leg swelling.  Gastrointestinal: Negative  for abdominal pain.  Musculoskeletal: Positive for myalgias, joint swelling, arthralgias and gait problem.  Skin: Positive for color change.  Psychiatric/Behavioral: Positive for sleep disturbance.      Objective:  BP 140/88 mmHg  Pulse 63  Temp(Src) 97.5 F (36.4 C) (Oral)  Resp 16  Ht 5' 9.75" (1.772 m)  Wt 198 lb 3.2 oz (89.903 kg)  BMI 28.63 kg/m2  SpO2 99% Physical Exam  Constitutional: He is oriented to person, place, and time. He appears well-developed and well-nourished. No distress.  HENT:  Head: Normocephalic and atraumatic.  Eyes: Pupils are equal, round, and reactive to light.  Neck: Normal range of motion.  Cardiovascular: Normal rate and regular rhythm.   Pulmonary/Chest: Effort normal. No respiratory distress.  Musculoskeletal: Normal range of motion.  Swelling over right first MTP. More so at the proximal aspect over the distal metatarsals than the phalanx with mild warmth, erythema and severe tenderness.  Neurological: He is alert and oriented to person, place, and time.  Skin: Skin is warm and dry.  Psychiatric: He has a normal mood and affect. His behavior is normal.  Nursing note and vitals reviewed.      Assessment & Plan:   1. Acute idiopathic gout of right foot     Orders Placed This Encounter  Procedures  . Uric Acid  . Sedimentation rate    Meds ordered this encounter  Medications  . predniSONE (DELTASONE) 20 MG tablet  Sig: 3 tabs po qd x3d, 2 tabs po qd x 3d, 1 tab po qd x 3d    Dispense:  18 tablet    Refill:  0  . allopurinol (ZYLOPRIM) 300 MG tablet    Sig: Take 1 tablet (300 mg total) by mouth daily.    Dispense:  90 tablet    Refill:  1    I personally performed the services described in this documentation, which was scribed in my presence. The recorded information has been reviewed and considered, and addended by me as needed.  Delman Cheadle, MD MPH    By signing my name below, I, Nadim Abuhashem, attest that this  documentation has been prepared under the direction and in the presence of Delman Cheadle, MD.  Electronically Signed: Lora Havens, medical scribe. 06/15/2015, 11:55 AM.

## 2015-06-15 NOTE — Patient Instructions (Addendum)
Start the prednisone course today - it is a stronger, longer course.  Take the pills altogether in the morning. In 1 week, start the allopurinol and take it daily with your lisinopril. You can use the indomethacin whenever you develop pain in your toe going forward but should not need for the next week while you are on the prednisone.  If you start to develop just a little pian or tingling  Gout Gout is an inflammatory arthritis caused by a buildup of uric acid crystals in the joints. Uric acid is a chemical that is normally present in the blood. When the level of uric acid in the blood is too high it can form crystals that deposit in your joints and tissues. This causes joint redness, soreness, and swelling (inflammation). Repeat attacks are common. Over time, uric acid crystals can form into masses (tophi) near a joint, destroying bone and causing disfigurement. Gout is treatable and often preventable. CAUSES  The disease begins with elevated levels of uric acid in the blood. Uric acid is produced by your body when it breaks down a naturally found substance called purines. Certain foods you eat, such as meats and fish, contain high amounts of purines. Causes of an elevated uric acid level include:  Being passed down from parent to child (heredity).  Diseases that cause increased uric acid production (such as obesity, psoriasis, and certain cancers).  Excessive alcohol use.  Diet, especially diets rich in meat and seafood.  Medicines, including certain cancer-fighting medicines (chemotherapy), water pills (diuretics), and aspirin.  Chronic kidney disease. The kidneys are no longer able to remove uric acid well.  Problems with metabolism. Conditions strongly associated with gout include:  Obesity.  High blood pressure.  High cholesterol.  Diabetes. Not everyone with elevated uric acid levels gets gout. It is not understood why some people get gout and others do not. Surgery, joint  injury, and eating too much of certain foods are some of the factors that can lead to gout attacks. SYMPTOMS   An attack of gout comes on quickly. It causes intense pain with redness, swelling, and warmth in a joint.  Fever can occur.  Often, only one joint is involved. Certain joints are more commonly involved:  Base of the big toe.  Knee.  Ankle.  Wrist.  Finger. Without treatment, an attack usually goes away in a few days to weeks. Between attacks, you usually will not have symptoms, which is different from many other forms of arthritis. DIAGNOSIS  Your caregiver will suspect gout based on your symptoms and exam. In some cases, tests may be recommended. The tests may include:  Blood tests.  Urine tests.  X-rays.  Joint fluid exam. This exam requires a needle to remove fluid from the joint (arthrocentesis). Using a microscope, gout is confirmed when uric acid crystals are seen in the joint fluid. TREATMENT  There are two phases to gout treatment: treating the sudden onset (acute) attack and preventing attacks (prophylaxis).  Treatment of an Acute Attack.  Medicines are used. These include anti-inflammatory medicines or steroid medicines.  An injection of steroid medicine into the affected joint is sometimes necessary.  The painful joint is rested. Movement can worsen the arthritis.  You may use warm or cold treatments on painful joints, depending which works best for you.  Treatment to Prevent Attacks.  If you suffer from frequent gout attacks, your caregiver may advise preventive medicine. These medicines are started after the acute attack subsides. These medicines either help  your kidneys eliminate uric acid from your body or decrease your uric acid production. You may need to stay on these medicines for a very long time.  The early phase of treatment with preventive medicine can be associated with an increase in acute gout attacks. For this reason, during the first  few months of treatment, your caregiver may also advise you to take medicines usually used for acute gout treatment. Be sure you understand your caregiver's directions. Your caregiver may make several adjustments to your medicine dose before these medicines are effective.  Discuss dietary treatment with your caregiver or dietitian. Alcohol and drinks high in sugar and fructose and foods such as meat, poultry, and seafood can increase uric acid levels. Your caregiver or dietitian can advise you on drinks and foods that should be limited. HOME CARE INSTRUCTIONS   Do not take aspirin to relieve pain. This raises uric acid levels.  Only take over-the-counter or prescription medicines for pain, discomfort, or fever as directed by your caregiver.  Rest the joint as much as possible. When in bed, keep sheets and blankets off painful areas.  Keep the affected joint raised (elevated).  Apply warm or cold treatments to painful joints. Use of warm or cold treatments depends on which works best for you.  Use crutches if the painful joint is in your leg.  Drink enough fluids to keep your urine clear or pale yellow. This helps your body get rid of uric acid. Limit alcohol, sugary drinks, and fructose drinks.  Follow your dietary instructions. Pay careful attention to the amount of protein you eat. Your daily diet should emphasize fruits, vegetables, whole grains, and fat-free or low-fat milk products. Discuss the use of coffee, vitamin C, and cherries with your caregiver or dietitian. These may be helpful in lowering uric acid levels.  Maintain a healthy body weight. SEEK MEDICAL CARE IF:   You develop diarrhea, vomiting, or any side effects from medicines.  You do not feel better in 24 hours, or you are getting worse. SEEK IMMEDIATE MEDICAL CARE IF:   Your joint becomes suddenly more tender, and you have chills or a fever. MAKE SURE YOU:   Understand these instructions.  Will watch your  condition.  Will get help right away if you are not doing well or get worse.   This information is not intended to replace advice given to you by your health care provider. Make sure you discuss any questions you have with your health care provider.   Document Released: 07/17/2000 Document Revised: 08/10/2014 Document Reviewed: 03/02/2012 Elsevier Interactive Patient Education [employer].

## 2015-06-17 ENCOUNTER — Encounter: Payer: Self-pay | Admitting: Family Medicine

## 2015-06-18 ENCOUNTER — Other Ambulatory Visit: Payer: Self-pay | Admitting: Family Medicine

## 2015-06-30 ENCOUNTER — Ambulatory Visit (INDEPENDENT_AMBULATORY_CARE_PROVIDER_SITE_OTHER): Payer: MEDICARE | Admitting: Emergency Medicine

## 2015-06-30 VITALS — BP 132/84 | HR 77 | Temp 97.9°F | Resp 16 | Ht 69.0 in | Wt 198.0 lb

## 2015-06-30 DIAGNOSIS — Z967 Presence of other bone and tendon implants: Secondary | ICD-10-CM | POA: Diagnosis not present

## 2015-06-30 DIAGNOSIS — M79671 Pain in right foot: Secondary | ICD-10-CM | POA: Diagnosis not present

## 2015-06-30 DIAGNOSIS — M10071 Idiopathic gout, right ankle and foot: Secondary | ICD-10-CM

## 2015-06-30 LAB — POCT CBC
Granulocyte percent: 61.5 %G (ref 37–80)
HCT, POC: 45.4 % (ref 43.5–53.7)
HEMOGLOBIN: 15.8 g/dL (ref 14.1–18.1)
Lymph, poc: 1.7 (ref 0.6–3.4)
MCH: 32.5 pg — AB (ref 27–31.2)
MCHC: 34.8 g/dL (ref 31.8–35.4)
MCV: 93.3 fL (ref 80–97)
MID (cbc): 0.2 (ref 0–0.9)
MPV: 6.8 fL (ref 0–99.8)
POC Granulocyte: 3.1 (ref 2–6.9)
POC LYMPH PERCENT: 34.5 %L (ref 10–50)
POC MID %: 4 % (ref 0–12)
Platelet Count, POC: 170 10*3/uL (ref 142–424)
RBC: 4.86 M/uL (ref 4.69–6.13)
RDW, POC: 13.6 %
WBC: 5 10*3/uL (ref 4.6–10.2)

## 2015-06-30 MED ORDER — PREDNISONE 10 MG PO TABS: ORAL_TABLET | ORAL | Status: DC

## 2015-06-30 NOTE — Progress Notes (Signed)
[facility] [address], [place] [address] [phone]  Date:  06/30/2015   Name:  Gerald Fisher   DOB:  27-Aug-1950   MRN:  [id number]  PCP:  No primary care provider on file.    Chief Complaint: Follow-up   History of Present Illness:  This is a 64 y.o. male with PMH HTN and gout who is presenting with goat flare in right 1st MTP. Gout flare started middle of October. He was seen here by Dr. Lorelei Pont on 10/21. He was started on indocin and colchicine. He returned 10 days later on 11/2 stating the meds had helped some but as soon as he became more physically active his symptoms returned. He saw Dr. Brigitte Pulse at that time. He was rx'd a prednisone taper. His symptoms resolved on the prednisone taper but 2-3 days after his symptoms returned. He again came back and saw Dr. Brigitte Pulse who put him on a more extended prednisone taper. He was instructed to start allopurinol for prevention once he finished the taper. He finished the taper 6 days ago. He had marked improvement in symptoms but still had some mild soreness. He started the allopurinol that day and has taken daily since. 2 days ago his symptoms flared again. He is having redness, swelling and pain of right 1st MTP. Gait is impaired but he is able to bear weight. He tried indomethacin past few days but wasn't helping his pain. He has not had any shellfish or alcohol the past 6 weeks. He has a history of gout in the right 1st MTP, first dx'd about 5 years ago. He gets 0-1 flares a year. He has never had a flare this bad or last this long. Radiograph on 11/2 showed post-op changes involving distal 1st metatarsal with mild 1st MTP OA and soft tissue swelling. Pt states he had foot surgery about 15 years ago. He forgot there was hardware inside. Labs over past 6 week: CMP wnl, CBC wnl, sed rate normal, uric acid wnl.  Review of Systems:  Review of Systems See HPI  Patient Active Problem List   Diagnosis Date Noted  . Acute  idiopathic gout of right foot 05/24/2015  . Essential hypertension 05/24/2015    Prior to Admission medications   Medication Sig Start Date End Date Taking? Authorizing Provider  allopurinol (ZYLOPRIM) 300 MG tablet Take 1 tablet (300 mg total) by mouth daily. 06/22/15  Yes Shawnee Knapp, MD  lisinopril (PRINIVIL,ZESTRIL) 10 MG tablet Take 1 tablet (10 mg total) by mouth daily. 05/24/15  Yes Darreld Mclean, MD                         No Known Allergies  Past Surgical History  Procedure Laterality Date  . Foot surgery      Social History  Substance Use Topics  . Smoking status: Never Smoker   . Smokeless tobacco: None  . Alcohol Use: Yes    History reviewed. No pertinent family history.  Medication list has been reviewed and updated.  Physical Examination:  Physical Exam  Constitutional: He is oriented to person, place, and time. He appears well-developed and well-nourished. No distress.  HENT:  Head: Normocephalic and atraumatic.  Right Ear: Hearing normal.  Left Ear: Hearing normal.  Nose: Nose normal.  Eyes: Conjunctivae and lids are normal. Right eye exhibits no discharge. Left eye exhibits no discharge. No scleral icterus.  Cardiovascular: Normal rate, regular rhythm and  normal pulses.   Pulmonary/Chest: Effort normal. No respiratory distress.  Musculoskeletal: Normal range of motion.  Neurological: He is alert and oriented to person, place, and time.  Skin: Skin is warm and dry.  Swelling over right 1st MTP. Erythema and mild swelling of entire right dorsal foot. Mild warmth. Mild TTP over MTP. Pedal pulses intact. ROM intact. Gait antalgic but able to bear weight.  Psychiatric: He has a normal mood and affect. His speech is normal and behavior is normal. Thought content normal.   BP 132/84 mmHg  Pulse 77  Temp(Src) 97.9 F (36.6 C) (Oral)  Resp 16  Ht 5\' 9"  (1.753 m)  Wt 198 lb (89.812 kg)  BMI 29.23 kg/m2  SpO2 99%  Results for orders placed or  performed in visit on 06/30/15  POCT CBC  Result Value Ref Range   WBC 5.0 4.6 - 10.2 K/uL   Lymph, poc 1.7 0.6 - 3.4   POC LYMPH PERCENT 34.5 10 - 50 %L   MID (cbc) 0.2 0 - 0.9   POC MID % 4.0 0 - 12 %M   POC Granulocyte 3.1 2 - 6.9   Granulocyte percent 61.5 37 - 80 %G   RBC 4.86 4.69 - 6.13 M/uL   Hemoglobin 15.8 14.1 - 18.1 g/dL   HCT, POC 45.4 43.5 - 53.7 %   MCV 93.3 80 - 97 fL   MCH, POC 32.5 (A) 27 - 31.2 pg   MCHC 34.8 31.8 - 35.4 g/dL   RDW, POC 13.6 %   Platelet Count, POC 170 142 - 424 K/uL   MPV 6.8 0 - 99.8 fL   Assessment and Plan:  1. Acute idiopathic gout of right foot 2. Foot pain, right 3. Fixation hardware in foot Pt has a history of gout in right 1st MTP which is the affected area currently. He has been treated 3 different times in past 6 weeks. Symptoms will resolve or mostly resolve on prednisone, but flares again a few days after stopping. Uric acid, CBC, CMP, sed rate all normal. Recent flare could be d/t starting allopurinol too early - he was still having some joint soreness when he started. Radiograph showed OA and hardware from a previous 1st MTP surgery. Questionable whether piece of hardware is inside MTP joint and could be contributing to symptoms ? Will treat with one more round of prednisone and he will stop allopurinol. Referral to ortho to eval possible hardware contribution. He will collect 24 hour urine uric acid since serum uric acid normal. Counseled not to use indocin or other NSAIDs while using prednisone. - POCT CBC - predniSONE (DELTASONE) 10 MG tablet; Take 4 tabs PO QAM x 3 days, then 3 tabs PO QAM x 3 days, then 2 tabs PO QAM x 3 days, then 1 tab PO QAM x 3 days.  Dispense: 30 tablet; Refill: 0 - Ambulatory referral to Orthopedic Surgery  Discussed case with Dr. Everlene Farrier.  Benjaman Pott Drenda Freeze, MHS [facility]  06/30/2015

## 2015-06-30 NOTE — Patient Instructions (Addendum)
Take prednisone taper as prescribed.  Stop the allopurinol. You will get a phone call to make appt with ortho.

## 2015-07-03 DIAGNOSIS — M109 Gout, unspecified: Secondary | ICD-10-CM | POA: Diagnosis not present

## 2015-07-03 LAB — URIC ACID, URINE, TIMED
URIC ACID 24H UR: 470 mg/(24.h) (ref 120–820)
URIC ACID, URINE: 47 mg/dL

## 2015-07-05 DIAGNOSIS — M2021 Hallux rigidus, right foot: Secondary | ICD-10-CM | POA: Diagnosis not present

## 2015-07-05 DIAGNOSIS — M79674 Pain in right toe(s): Secondary | ICD-10-CM | POA: Diagnosis not present

## 2015-07-08 ENCOUNTER — Telehealth: Payer: Self-pay | Admitting: Physician Assistant

## 2015-07-08 NOTE — Telephone Encounter (Signed)
Called pt and left vm to cb. Let him know 24 urine had normal levels of uric acid making gout less likely. More likely that hardware in that toe could be causing problems. Please ask pt he is doing and ask him what ortho proposes they do and let me know.

## 2015-07-09 NOTE — Telephone Encounter (Signed)
Left message for pt to call back  °

## 2015-07-11 NOTE — Telephone Encounter (Signed)
Phone number was disconnected.

## 2015-07-12 NOTE — Telephone Encounter (Signed)
Sent [facility] letter. 

## 2015-08-07 DIAGNOSIS — M2021 Hallux rigidus, right foot: Secondary | ICD-10-CM | POA: Diagnosis not present

## 2015-10-17 ENCOUNTER — Ambulatory Visit (INDEPENDENT_AMBULATORY_CARE_PROVIDER_SITE_OTHER): Payer: MEDICARE | Admitting: Urgent Care

## 2015-10-17 VITALS — BP 130/88 | HR 86 | Temp 98.3°F | Resp 18 | Wt 204.8 lb

## 2015-10-17 DIAGNOSIS — R0982 Postnasal drip: Secondary | ICD-10-CM

## 2015-10-17 DIAGNOSIS — J069 Acute upper respiratory infection, unspecified: Secondary | ICD-10-CM

## 2015-10-17 DIAGNOSIS — J329 Chronic sinusitis, unspecified: Secondary | ICD-10-CM | POA: Diagnosis not present

## 2015-10-17 DIAGNOSIS — R05 Cough: Secondary | ICD-10-CM

## 2015-10-17 DIAGNOSIS — R059 Cough, unspecified: Secondary | ICD-10-CM

## 2015-10-17 MED ORDER — PSEUDOEPHEDRINE HCL ER 120 MG PO TB12: 120.0000 mg | ORAL_TABLET | Freq: Two times a day (BID) | ORAL | Status: DC

## 2015-10-17 MED ORDER — HYDROCODONE-HOMATROPINE 5-1.5 MG/5ML PO SYRP: 5.0000 mL | ORAL_SOLUTION | Freq: Every evening | ORAL | Status: DC | PRN

## 2015-10-17 MED ORDER — CETIRIZINE HCL 10 MG PO TABS: 10.0000 mg | ORAL_TABLET | Freq: Every day | ORAL | Status: DC

## 2015-10-17 MED ORDER — BENZONATATE 100 MG PO CAPS: 100.0000 mg | ORAL_CAPSULE | Freq: Three times a day (TID) | ORAL | Status: DC | PRN

## 2015-10-17 NOTE — Progress Notes (Signed)
    MRN: [id number] DOB: 1951-01-15  Subjective:   Gerald Fisher is a 65 y.o. male presenting for chief complaint of URI  Reports 1 week history of runny nose, post-nasal drainage, productive cough. Has tried otc medications with minimal relief. Admits history of allergies. Denies smoking cigarettes. Denies chest pain, shob, wheezing, n/v, abdominal pain, sore throat, congestion, sinus pain, ear pain, ear drainage.  Gerald Fisher has a current medication list which includes the following prescription(s): allopurinol, lisinopril, colchicine, indomethacin, and prednisone. Also has No Known Allergies.  Gerald Fisher  has no past medical history on file. Also  has past surgical history that includes Foot surgery.  Objective:   Vitals: BP 130/88 mmHg  Pulse 86  Temp(Src) 98.3 F (36.8 C) (Oral)  Resp 18  Wt 204 lb 12.8 oz (92.897 kg)  SpO2 98%  Physical Exam  Constitutional: He is oriented to person, place, and time. He appears well-developed and well-nourished.  HENT:  TM's intact bilaterally, no effusions or erythema. Nasal turbinates pink and moist, nasal passages patent. No sinus tenderness. Oropharynx clear, mucous membranes moist, dentition in good repair.  Eyes: Right eye exhibits no discharge. Left eye exhibits no discharge. No scleral icterus.  Neck: Normal range of motion. Neck supple.  Cardiovascular: Normal rate, regular rhythm and intact distal pulses.  Exam reveals no gallop and no friction rub.   No murmur heard. Pulmonary/Chest: No respiratory distress. He has no wheezes. He has no rales.  Lymphadenopathy:    He has no cervical adenopathy.  Neurological: He is alert and oriented to person, place, and time.  Skin: Skin is warm and dry.   Assessment and Plan :   1. Acute upper respiratory infection 2. Cough 3. Post-nasal drainage - Likely viral in etiology, patient notes that he is improving. I counseled on supportive care. Patient is okay to call in 3-4 days if not better,  consider antibiotic course depending on symptom progression. Patient agreed.  Jaynee Eagles, PA-C [facility] [phone] 10/17/2015 12:05 PM

## 2016-05-21 ENCOUNTER — Other Ambulatory Visit: Payer: Self-pay | Admitting: Emergency Medicine

## 2016-05-21 DIAGNOSIS — I10 Essential (primary) hypertension: Secondary | ICD-10-CM

## 2016-05-27 ENCOUNTER — Other Ambulatory Visit: Payer: Self-pay | Admitting: Emergency Medicine

## 2016-05-27 DIAGNOSIS — I10 Essential (primary) hypertension: Secondary | ICD-10-CM

## 2016-05-27 MED ORDER — LISINOPRIL 10 MG PO TABS: 10.0000 mg | ORAL_TABLET | Freq: Every day | ORAL | 0 refills | Status: DC

## 2016-07-28 ENCOUNTER — Other Ambulatory Visit: Payer: Self-pay | Admitting: Family Medicine

## 2016-07-28 DIAGNOSIS — I10 Essential (primary) hypertension: Secondary | ICD-10-CM

## 2016-08-08 ENCOUNTER — Other Ambulatory Visit: Payer: Self-pay | Admitting: Family Medicine

## 2016-08-08 DIAGNOSIS — I10 Essential (primary) hypertension: Secondary | ICD-10-CM

## 2016-08-17 ENCOUNTER — Ambulatory Visit (INDEPENDENT_AMBULATORY_CARE_PROVIDER_SITE_OTHER): Payer: Medicare HMO | Admitting: Emergency Medicine

## 2016-08-17 VITALS — BP 138/88 | HR 63 | Temp 97.7°F | Resp 16 | Ht 70.0 in | Wt 205.0 lb

## 2016-08-17 DIAGNOSIS — I10 Essential (primary) hypertension: Secondary | ICD-10-CM | POA: Diagnosis not present

## 2016-08-17 DIAGNOSIS — Z23 Encounter for immunization: Secondary | ICD-10-CM | POA: Diagnosis not present

## 2016-08-17 MED ORDER — LISINOPRIL 10 MG PO TABS: 10.0000 mg | ORAL_TABLET | Freq: Every day | ORAL | 6 refills | Status: DC

## 2016-08-17 NOTE — Patient Instructions (Addendum)
     IF you received an x-ray today, you will receive an invoice from [facility]. Please contact [facility] at [phone] with questions or concerns regarding your invoice.   IF you received labwork today, you will receive an invoice from [facility]. Please contact [facility] at [phone] with questions or concerns regarding your invoice.   Our billing staff will not be able to assist you with questions regarding bills from these companies.  You will be contacted with the lab results as soon as they are available. The fastest way to get your results is to activate your My Chart account. Instructions are located on the last page of this paperwork. If you have not heard from [place] regarding the results in 2 weeks, please contact this office.      Hypertension Hypertension is another name for high blood pressure. High blood pressure forces your heart to work harder to pump blood. A blood pressure reading has two numbers, which includes a higher number over a lower number (example: 110/72). Follow these instructions at home:  Have your blood pressure rechecked by your doctor.  Only take medicine as told by your doctor. Follow the directions carefully. The medicine does not work as well if you skip doses. Skipping doses also puts you at risk for problems.  Do not smoke.  Monitor your blood pressure at home as told by your doctor. Contact a doctor if:  You think you are having a reaction to the medicine you are taking.  You have repeat headaches or feel dizzy.  You have puffiness (swelling) in your ankles.  You have trouble with your vision. Get help right away if:  You get a very bad headache and are confused.  You feel weak, numb, or faint.  You get chest or belly (abdominal) pain.  You throw up (vomit).  You cannot breathe very well. This information is not intended to replace advice given to you by your health care provider. Make sure you discuss any  questions you have with your health care provider. Document Released: 01/06/2008 Document Revised: 12/26/2015 Document Reviewed: 05/12/2013 Elsevier Interactive Patient Education  2017 [employer].

## 2016-08-17 NOTE — Progress Notes (Signed)
Gerald Fisher 66 y.o.   Chief Complaint  Patient presents with  . Medication Refill    Lisinopril  . Immunizations    flu    HISTORY OF PRESENT ILLNESS: This is a 66 y.o. male here for medication refill; has no complaints; also wants flu vaccination. Hasn't taken BP medication in 3-4 days.  HPI   Prior to Admission medications   Medication Sig Start Date End Date Taking? Authorizing Provider  lisinopril (PRINIVIL,ZESTRIL) 10 MG tablet Take 1 tablet (10 mg total) by mouth daily. 08/17/16  Yes Tequilla Cousineau Joellen Jersey, MD    No Known Allergies  Patient Active Problem List   Diagnosis Date Noted  . 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 Essential hypertension 05/24/2015    No past medical history on file.  Past Surgical History:  Procedure Laterality Date  . FOOT SURGERY      Social History   Social History  . Marital status: Single    Spouse name: N/A  . Number of children: N/A  . Years of education: N/A   Occupational History  . Not on file.   Social History Main Topics  . Smoking status: Never Smoker  . Smokeless tobacco: Never Used  . Alcohol use Yes  . Drug use: No  . Sexual activity: Yes    Birth control/ protection: Condom   Other Topics Concern  . Not on file   Social History Narrative  . No narrative on file    No family history on file.   Review of Systems  Constitutional: Negative.   HENT: Negative.   Eyes: Negative.   Respiratory: Negative.   Cardiovascular: Negative.   Gastrointestinal: Negative.   Genitourinary: Negative.   Musculoskeletal: Negative.   Skin: Negative.   Neurological: Negative.   Endo/Heme/Allergies: Negative.   Psychiatric/Behavioral: Negative.   All other systems reviewed and are negative.  Vitals:   08/17/16 1006  BP: 138/88  Pulse: 63  Resp: 16  Temp: 97.7 F (36.5 C)    Physical Exam  Constitutional: He is oriented to person, place, and time. He appears well-developed and well-nourished.   HENT:  Head: Normocephalic and atraumatic.  Eyes: Conjunctivae and EOM are normal. Pupils are equal, round, and reactive to light.  Neck: Normal range of motion. Neck supple.  Cardiovascular: Normal rate, regular rhythm, normal heart sounds and intact distal pulses.   Abdominal: Soft. There is no tenderness. There is no guarding.  Musculoskeletal: Normal range of motion.  Neurological: He is alert and oriented to person, place, and time. No sensory deficit. He exhibits normal muscle tone.  Skin: Skin is warm and dry. Capillary refill takes less than 2 seconds.  Psychiatric: He has a normal mood and affect.  Vitals reviewed.    ASSESSMENT & PLAN: Gerald Fisher was seen today for medication refill and immunizations.  Diagnoses and all orders for this visit:  Essential hypertension -     lisinopril (PRINIVIL,ZESTRIL) 10 MG tablet; Take 1 tablet (10 mg total) by mouth daily.  Need for prophylactic vaccination and inoculation against influenza -     Flu Vaccine QUAD 36+ mos IM    Patient Instructions       IF you received an x-ray today, you will receive an invoice from [facility]. Please contact [facility] at [phone] with questions or concerns regarding your invoice.   IF you received labwork today, you will receive an invoice from [address]. Please contact [facility] at [phone] with questions or concerns regarding  your invoice.   Our billing staff will not be able to assist you with questions regarding bills from these companies.  You will be contacted with the lab results as soon as they are available. The fastest way to get your results is to activate your My Chart account. Instructions are located on the last page of this paperwork. If you have not heard from [place] regarding the results in 2 weeks, please contact this office.      Hypertension Hypertension is another name for high blood pressure. High blood pressure forces your heart to work harder to  pump blood. A blood pressure reading has two numbers, which includes a higher number over a lower number (example: 110/72). Follow these instructions at home:  Have your blood pressure rechecked by your doctor.  Only take medicine as told by your doctor. Follow the directions carefully. The medicine does not work as well if you skip doses. Skipping doses also puts you at risk for problems.  Do not smoke.  Monitor your blood pressure at home as told by your doctor. Contact a doctor if:  You think you are having a reaction to the medicine you are taking.  You have repeat headaches or feel dizzy.  You have puffiness (swelling) in your ankles.  You have trouble with your vision. Get help right away if:  You get a very bad headache and are confused.  You feel weak, numb, or faint.  You get chest or belly (abdominal) pain.  You throw up (vomit).  You cannot breathe very well. This information is not intended to replace advice given to you by your health care provider. Make sure you discuss any questions you have with your health care provider. Document Released: 01/06/2008 Document Revised: 12/26/2015 Document Reviewed: 05/12/2013 Elsevier Interactive Patient Education  2017 Elsevier Inc.      [name], MD [facility]

## 2016-09-21 ENCOUNTER — Ambulatory Visit (INDEPENDENT_AMBULATORY_CARE_PROVIDER_SITE_OTHER): Payer: Medicare HMO | Admitting: Family Medicine

## 2016-09-21 VITALS — BP 164/82 | HR 88 | Temp 98.1°F | Resp 18 | Ht 70.0 in | Wt 207.6 lb

## 2016-09-21 DIAGNOSIS — R05 Cough: Secondary | ICD-10-CM

## 2016-09-21 DIAGNOSIS — I10 Essential (primary) hypertension: Secondary | ICD-10-CM | POA: Diagnosis not present

## 2016-09-21 DIAGNOSIS — R059 Cough, unspecified: Secondary | ICD-10-CM

## 2016-09-21 NOTE — Patient Instructions (Addendum)
Keep a record of your blood pressures outside of the office and if remaining over 140/90 in next week - return to discuss changes.. Cut back on alcohol to no more than one drink per day if at all.  Avoid cold medicines other than Coricidin HBP. Keep taking lisinopril once per day.   Try over the counter Claritin once per day for possible drainage from allergies causing cough at night and if that doesn't help try Zantac (heartburn can also cause the cough). If neither one is effective in next few weeks - return as we may need to change lisinopril to other class.    How to Take Your Blood Pressure HOW DO I GET A BLOOD PRESSURE MACHINE?  You can buy an electronic home blood pressure machine at your local pharmacy. Insurance will sometimes cover the cost if you have a prescription.  Ask your doctor what type of machine is best for you. There are different machines for your arm and your wrist.  If you decide to buy a machine to check your blood pressure on your arm, first check the size of your arm so you can buy the right size cuff. To check the size of your arm:   Use a measuring tape that shows both inches and centimeters.   Wrap the measuring tape around the upper-middle part of your arm. You may need someone to help you measure.   Write down your arm measurement in both inches and centimeters.   To measure your blood pressure correctly, it is important to have the right size cuff.   If your arm is up to 13 inches (up to 34 centimeters), get an adult cuff size.  If your arm is 13 to 17 inches (35 to 44 centimeters), get a large adult cuff size.    If your arm is 17 to 20 inches (45 to 52 centimeters), get an adult thigh cuff.  WHAT DO THE NUMBERS MEAN?   There are two numbers that make up your blood pressure. For example: 120/80.  The first number (120 in our example) is called the "systolic pressure." It is a measure of the pressure in your blood vessels when your heart is  pumping blood.  The second number (80 in our example) is called the "diastolic pressure." It is a measure of the pressure in your blood vessels when your heart is resting between beats.  Your doctor will tell you what your blood pressure should be. WHAT SHOULD I DO BEFORE I CHECK MY BLOOD PRESSURE?   Try to rest or relax for at least 30 minutes before you check your blood pressure.  Do not smoke.  Do not have any drinks with caffeine, such as:  Soda.  Coffee.  Tea.  Check your blood pressure in a quiet room.  Sit down and stretch out your arm on a table. Keep your arm at about the level of your heart. Let your arm relax.  Make sure that your legs are not crossed. HOW DO I CHECK MY BLOOD PRESSURE?  Follow the directions that came with your machine.  Make sure you remove any tight-fitting clothing from your arm or wrist. Wrap the cuff around your upper arm or wrist. You should be able to fit a finger between the cuff and your arm. If you cannot fit a finger between the cuff and your arm, it is too tight and should be removed and rewrapped.  Some units require you to manually pump up the arm cuff.  Automatic units inflate the cuff when you press a button.  Cuff deflation is automatic in both models.  After the cuff is inflated, the unit measures your blood pressure and pulse. The readings are shown on a monitor. Hold still and breathe normally while the cuff is inflated.  Getting a reading takes less than a minute.  Some models store readings in a memory. Some provide a printout of readings. If your machine does not store your readings, keep a written record.  Take readings with you to your next visit with your doctor. This information is not intended to replace advice given to you by your health care provider. Make sure you discuss any questions you have with your health care provider. Document Released: 07/02/2008 Document Revised: 08/10/2014 Document Reviewed:  09/14/2013 Elsevier Interactive Patient Education  2017 [employer].   IF you received an x-ray today, you will receive an invoice from [facility]. Please contact [facility] at [phone] with questions or concerns regarding your invoice.   IF you received labwork today, you will receive an invoice from [place]. Please contact [facility] at [phone] with questions or concerns regarding your invoice.   Our billing staff will not be able to assist you with questions regarding bills from these companies.  You will be contacted with the lab results as soon as they are available. The fastest way to get your results is to activate your My Chart account. Instructions are located on the last page of this paperwork. If you have not heard from [place] regarding the results in 2 weeks, please contact this office.      Hypertension Hypertension, commonly called high blood pressure, is when the force of blood pumping through your arteries is too strong. Your arteries are the blood vessels that carry blood from your heart throughout your body. A blood pressure reading consists of a higher number over a lower number, such as 110/72. The higher number (systolic) is the pressure inside your arteries when your heart pumps. The lower number (diastolic) is the pressure inside your arteries when your heart relaxes. Ideally you want your blood pressure below 120/80. Hypertension forces your heart to work harder to pump blood. Your arteries may become narrow or stiff. Having untreated or uncontrolled hypertension can cause heart attack, stroke, kidney disease, and other problems. What increases the risk? Some risk factors for high blood pressure are controllable. Others are not. Risk factors you cannot control include:  Race. You may be at higher risk if you are African American.  Age. Risk increases with age.  Gender. Men are at higher risk than women before age 49 years. After age 70,  women are at higher risk than men. Risk factors you can control include:  Not getting enough exercise or physical activity.  Being overweight.  Getting too much fat, sugar, calories, or salt in your diet.  Drinking too much alcohol. What are the signs or symptoms? Hypertension does not usually cause signs or symptoms. Extremely high blood pressure (hypertensive crisis) may cause headache, anxiety, shortness of breath, and nosebleed. How is this diagnosed? To check if you have hypertension, your health care provider will measure your blood pressure while you are seated, with your arm held at the level of your heart. It should be measured at least twice using the same arm. Certain conditions can cause a difference in blood pressure between your right and left arms. A blood pressure reading that is higher than normal on one occasion does not mean that  you need treatment. If it is not clear whether you have high blood pressure, you may be asked to return on a different day to have your blood pressure checked again. Or, you may be asked to monitor your blood pressure at home for 1 or more weeks. How is this treated? Treating high blood pressure includes making lifestyle changes and possibly taking medicine. Living a healthy lifestyle can help lower high blood pressure. You may need to change some of your habits. Lifestyle changes may include:  Following the DASH diet. This diet is high in fruits, vegetables, and whole grains. It is low in salt, red meat, and added sugars.  Keep your sodium intake below 2,300 mg per day.  Getting at least 30-45 minutes of aerobic exercise at least 4 times per week.  Losing weight if necessary.  Not smoking.  Limiting alcoholic beverages.  Learning ways to reduce stress. Your health care provider may prescribe medicine if lifestyle changes are not enough to get your blood pressure under control, and if one of the following is true:  You are 18-59 years of  age and your systolic blood pressure is above 140.  You are 41 years of age or older, and your systolic blood pressure is above 150.  Your diastolic blood pressure is above 90.  You have diabetes, and your systolic blood pressure is over [id number] or your diastolic blood pressure is over 90.  You have kidney disease and your blood pressure is above 140/90.  You have heart disease and your blood pressure is above 140/90. Your personal target blood pressure may vary depending on your medical conditions, your age, and other factors. Follow these instructions at home:  Have your blood pressure rechecked as directed by your health care provider.  Take medicines only as directed by your health care provider. Follow the directions carefully. Blood pressure medicines must be taken as prescribed. The medicine does not work as well when you skip doses. Skipping doses also puts you at risk for problems.  Do not smoke.  Monitor your blood pressure at home as directed by your health care provider. Contact a health care provider if:  You think you are having a reaction to medicines taken.  You have recurrent headaches or feel dizzy.  You have swelling in your ankles.  You have trouble with your vision. Get help right away if:  You develop a severe headache or confusion.  You have unusual weakness, numbness, or feel faint.  You have severe chest or abdominal pain.  You vomit repeatedly.  You have trouble breathing. This information is not intended to replace advice given to you by your health care provider. Make sure you discuss any questions you have with your health care provider. Document Released: 07/20/2005 Document Revised: 12/26/2015 Document Reviewed: 05/12/2013 Elsevier Interactive Patient Education  2017 [employer] Inc.   Cough, Adult Coughing is a reflex that clears your throat and your airways. Coughing helps to heal and protect your lungs. It is normal to cough occasionally, but  a cough that happens with other symptoms or lasts a long time may be a sign of a condition that needs treatment. A cough may last only 2-3 weeks (acute), or it may last longer than 8 weeks (chronic). What are the causes? Coughing is commonly caused by:  Breathing in substances that irritate your lungs.  A viral or bacterial respiratory infection.  Allergies.  Asthma.  Postnasal drip.  Smoking.  Acid backing up from the stomach into the  esophagus (gastroesophageal reflux).  Certain medicines.  Chronic lung problems, including COPD (or rarely, lung cancer).  Other medical conditions such as heart failure. Follow these instructions at home: Pay attention to any changes in your symptoms. Take these actions to help with your discomfort:  Take medicines only as told by your health care provider.  If you were prescribed an antibiotic medicine, take it as told by your health care provider. Do not stop taking the antibiotic even if you start to feel better.  Talk with your health care provider before you take a cough suppressant medicine.  Drink enough fluid to keep your urine clear or pale yellow.  If the air is dry, use a cold steam vaporizer or humidifier in your bedroom or your home to help loosen secretions.  Avoid anything that causes you to cough at work or at home.  If your cough is worse at night, try sleeping in a semi-upright position.  Avoid cigarette smoke. If you smoke, quit smoking. If you need help quitting, ask your health care provider.  Avoid caffeine.  Avoid alcohol.  Rest as needed. Contact a health care provider if:  You have new symptoms.  You cough up pus.  Your cough does not get better after 2-3 weeks, or your cough gets worse.  You cannot control your cough with suppressant medicines and you are losing sleep.  You develop pain that is getting worse or pain that is not controlled with pain medicines.  You have a fever.  You have  unexplained weight loss.  You have night sweats. Get help right away if:  You cough up blood.  You have difficulty breathing.  Your heartbeat is very fast. This information is not intended to replace advice given to you by your health care provider. Make sure you discuss any questions you have with your health care provider. Document Released: 01/16/2011 Document Revised: 12/26/2015 Document Reviewed: 09/26/2014 Elsevier Interactive Patient Education  2017 [employer].

## 2016-09-21 NOTE — Progress Notes (Signed)
By signing my name below, I, Gerald Fisher, attest that this documentation has been prepared under the direction and in the presence of Gerald Ray, MD.  Electronically Signed: Verlee Fisher, Medical Scribe. 09/21/16. 11:02 AM.  Subjective:    Patient ID: Gerald Fisher, male    DOB: May 16, 1951, 66 y.o.   MRN: [id number]  HPI Chief Complaint  Patient presents with  . Hypertension    states he has had a cold and his bp was extremely high    HPI Comments: Gerald Fisher is a 66 y.o. male with a PMHx of HTN who presents to the [facility] complaining of HTN onset a month. Gerald Fisher was last seen approx 1 month ago by Gerald Fisher, he had been off of medication for a few days at that time but Previously taking lisinopril 10 mg QD, continued same dose.  Congestion: Pt has been experienced chest congestion and some rhinorrhea onset a month and a night cough that occasionally wakes him since December, 2 months ago. Not coughing during the day, typically only at night. Denies heartburn or allergy symptoms but does feel some congestion in the back of his throat at times. Pt also had HA 5 days ago that has resolved since. Took mucinex, Coricidin, and OTC decongestant for some relief of his sxs. No fever, no chest pain.  HTN: He has been compliant with lisinopril for the past 4 days, but for the last month he wasn't taking lisinopril consistently ( "missed a couple of days in a row several times" ). Pt hasn't been consistently checking his bp and his bp has been in the 140-150s/90s when checked and it scared him into discontinuing his medication since he felt like it wasn't effective. He has been drinking liquor 1-5 drinks a day over the past weeks, but hasn't drank in the past 4-5 days. He denies  problems with DUI or alcohol addiction in the past or currently. Pt hasn't been walking/exercising for a month now. Denies fevers, chest pain, and SOB with his cough.  Lab Results    Component Value Date   CREATININE 1.21 05/24/2015   BP Readings from Last 3 Encounters:  09/21/16 (!) 164/82  08/17/16 138/88  10/17/15 130/88    Patient Active Problem List   Diagnosis Date Noted  . 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 Essential hypertension 05/24/2015   No past medical history on file. Past Surgical History:  Procedure Laterality Date  . FOOT SURGERY     No Known Allergies Prior to Admission medications   Medication Sig Start Date End Date Taking? Authorizing Provider  lisinopril (PRINIVIL,ZESTRIL) 10 MG tablet Take 1 tablet (10 mg total) by mouth daily. 08/17/16  Yes [place], MD   Social History   Social History  . Marital status: Single    Spouse name: N/A  . Number of children: N/A  . Years of education: N/A   Occupational History  . Not on file.   Social History Main Topics  . Smoking status: Never Smoker  . Smokeless tobacco: Never Used  . Alcohol use Yes  . Drug use: No  . Sexual activity: Yes    Birth control/ protection: Condom   Other Topics Concern  . Not on file   Social History Narrative  . No narrative on file   Review of Systems  Constitutional: Negative for fatigue, fever and unexpected weight change.  HENT: Positive for congestion and rhinorrhea.  Eyes: Negative for visual disturbance.  Respiratory: Positive for cough. Negative for chest tightness and shortness of breath.   Cardiovascular: Negative for chest pain, palpitations and leg swelling.  Gastrointestinal: Negative for abdominal pain.  Neurological: Positive for headaches. Negative for dizziness and light-headedness.  Psychiatric/Behavioral: Positive for sleep disturbance (with cough at night. ).    Objective:  Physical Exam  Constitutional: He is oriented to person, place, and time. He appears well-developed and well-nourished. No distress.  HENT:  Head: Normocephalic and atraumatic.  Right Ear: Tympanic membrane, external ear and  ear canal normal.  Left Ear: Tympanic membrane, external ear and ear canal normal.  Nose: No rhinorrhea.  Mouth/Throat: Oropharynx is clear and moist and mucous membranes are normal. No oropharyngeal exudate or posterior oropharyngeal erythema.  Eyes: Conjunctivae and EOM are normal. Pupils are equal, round, and reactive to light. Right eye exhibits no nystagmus. Left eye exhibits no nystagmus.  Neck: Neck supple. No JVD present. Carotid bruit is not present.  Cardiovascular: Normal rate, regular rhythm, normal heart sounds and intact distal pulses.  Exam reveals no friction rub.   No murmur heard. Pulmonary/Chest: Effort normal and breath sounds normal. No respiratory distress. He has no wheezes. He has no rhonchi. He has no rales.  Abdominal: Soft. There is no tenderness.  Musculoskeletal: He exhibits no edema (lower extremity).  Lymphadenopathy:    He has no cervical adenopathy.  Neurological: He is alert and oriented to person, place, and time. He displays a negative Romberg sign.  No pronator drift Finger to nose test nl Non focal neuro exam  Skin: Skin is warm and dry. No rash noted.  Psychiatric: He has a normal mood and affect. His behavior is normal.  Nursing note and vitals reviewed.   Vitals:   09/21/16 1012 09/21/16 1052  BP: (!) 176/101 (!) 164/82  Pulse: 81 88  Resp: 18   Temp: 98.1 F (36.7 C)   TempSrc: Oral   SpO2: 99%   Weight: 207 lb 9.6 oz (94.2 kg)   Height: 5\' 10"  (1.778 m)   Body mass index is 29.79 kg/m. Assessment & Plan:   Gerald Fisher is a 66 y.o. male Essential hypertension - Plan: Basic metabolic panel  - Elevated readings recently may be multifactorial including intermittent dosing of lisinopril, occasional alcohol use, and possible decongestant use with cold medicines over-the-counter. Single headache now resolved, otherwise asymptomatic and nonfocal exam currently.  -Continue lisinopril 10 mg daily, check BMP, avoid decongestants, and avoid  alcohol if possible or no more than 1 per day. Advised let me know if he has difficulty cutting back.  -Check BP out of office, keep record, return in next 1 week if persistently elevated readings. Rtc/er precautions.   Cough  - Possible multifactorial with postnasal drip/allergies, recent URI, possible heartburn, versus less likely ACE inhibitor cough.  -Start with Claritin, Zantac if needed, and RTC if persistent cough in spite of those treatments.   No orders of the defined types were placed in this encounter.  Patient Instructions   Keep a record of your blood pressures outside of the office and if remaining over 140/90 in next week - return to discuss changes.. Cut back on alcohol to no more than one drink per day if at all.  Avoid cold medicines other than Coricidin HBP. Keep taking lisinopril once per day.   Try over the counter Claritin once per day for possible drainage from allergies causing cough at night and if that doesn't help  try Zantac (heartburn can also cause the cough). If neither one is effective in next few weeks - return as we may need to change lisinopril to other class.    How to Take Your Blood Pressure HOW DO I GET A BLOOD PRESSURE MACHINE?  You can buy an electronic home blood pressure machine at your local pharmacy. Insurance will sometimes cover the cost if you have a prescription.  Ask your doctor what type of machine is best for you. There are different machines for your arm and your wrist.  If you decide to buy a machine to check your blood pressure on your arm, first check the size of your arm so you can buy the right size cuff. To check the size of your arm:   Use a measuring tape that shows both inches and centimeters.   Wrap the measuring tape around the upper-middle part of your arm. You may need someone to help you measure.   Write down your arm measurement in both inches and centimeters.   To measure your blood pressure correctly, it is  important to have the right size cuff.   If your arm is up to 13 inches (up to 34 centimeters), get an adult cuff size.  If your arm is 13 to 17 inches (35 to 44 centimeters), get a large adult cuff size.    If your arm is 17 to 20 inches (45 to 52 centimeters), get an adult thigh cuff.  WHAT DO THE NUMBERS MEAN?   There are two numbers that make up your blood pressure. For example: 120/80.  The first number (120 in our example) is called the "systolic pressure." It is a measure of the pressure in your blood vessels when your heart is pumping blood.  The second number (80 in our example) is called the "diastolic pressure." It is a measure of the pressure in your blood vessels when your heart is resting between beats.  Your doctor will tell you what your blood pressure should be. WHAT SHOULD I DO BEFORE I CHECK MY BLOOD PRESSURE?   Try to rest or relax for at least 30 minutes before you check your blood pressure.  Do not smoke.  Do not have any drinks with caffeine, such as:  Soda.  Coffee.  Tea.  Check your blood pressure in a quiet room.  Sit down and stretch out your arm on a table. Keep your arm at about the level of your heart. Let your arm relax.  Make sure that your legs are not crossed. HOW DO I CHECK MY BLOOD PRESSURE?  Follow the directions that came with your machine.  Make sure you remove any tight-fitting clothing from your arm or wrist. Wrap the cuff around your upper arm or wrist. You should be able to fit a finger between the cuff and your arm. If you cannot fit a finger between the cuff and your arm, it is too tight and should be removed and rewrapped.  Some units require you to manually pump up the arm cuff.  Automatic units inflate the cuff when you press a button.  Cuff deflation is automatic in both models.  After the cuff is inflated, the unit measures your blood pressure and pulse. The readings are shown on a monitor. Hold still and breathe  normally while the cuff is inflated.  Getting a reading takes less than a minute.  Some models store readings in a memory. Some provide a printout of readings. If your machine  does not store your readings, keep a written record.  Take readings with you to your next visit with your doctor. This information is not intended to replace advice given to you by your health care provider. Make sure you discuss any questions you have with your health care provider. Document Released: 07/02/2008 Document Revised: 08/10/2014 Document Reviewed: 09/14/2013 Elsevier Interactive Patient Education  2017 [employer].   IF you received an x-Fisher today, you will receive an invoice from [facility]. Please contact [facility] at [phone] with questions or concerns regarding your invoice.   IF you received labwork today, you will receive an invoice from [facility]. Please contact [facility] at [phone] with questions or concerns regarding your invoice.   Our billing staff will not be able to assist you with questions regarding bills from these companies.  You will be contacted with the lab results as soon as they are available. The fastest way to get your results is to activate your My Chart account. Instructions are located on the last page of this paperwork. If you have not heard from [place] regarding the results in 2 weeks, please contact this office.      Hypertension Hypertension, commonly called high blood pressure, is when the force of blood pumping through your arteries is too strong. Your arteries are the blood vessels that carry blood from your heart throughout your body. A blood pressure reading consists of a higher number over a lower number, such as 110/72. The higher number (systolic) is the pressure inside your arteries when your heart pumps. The lower number (diastolic) is the pressure inside your arteries when your heart relaxes. Ideally you want your blood pressure below  120/80. Hypertension forces your heart to work harder to pump blood. Your arteries may become narrow or stiff. Having untreated or uncontrolled hypertension can cause heart attack, stroke, kidney disease, and other problems. What increases the risk? Some risk factors for high blood pressure are controllable. Others are not. Risk factors you cannot control include:  Race. You may be at higher risk if you are African American.  Age. Risk increases with age.  Gender. Men are at higher risk than women before age 28 years. After age 32, women are at higher risk than men. Risk factors you can control include:  Not getting enough exercise or physical activity.  Being overweight.  Getting too much fat, sugar, calories, or salt in your diet.  Drinking too much alcohol. What are the signs or symptoms? Hypertension does not usually cause signs or symptoms. Extremely high blood pressure (hypertensive crisis) may cause headache, anxiety, shortness of breath, and nosebleed. How is this diagnosed? To check if you have hypertension, your health care provider will measure your blood pressure while you are seated, with your arm held at the level of your heart. It should be measured at least twice using the same arm. Certain conditions can cause a difference in blood pressure between your right and left arms. A blood pressure reading that is higher than normal on one occasion does not mean that you need treatment. If it is not clear whether you have high blood pressure, you may be asked to return on a different day to have your blood pressure checked again. Or, you may be asked to monitor your blood pressure at home for 1 or more weeks. How is this treated? Treating high blood pressure includes making lifestyle changes and possibly taking medicine. Living a healthy lifestyle can help lower high blood pressure. You may  need to change some of your habits. Lifestyle changes may include:  Following the DASH  diet. This diet is high in fruits, vegetables, and whole grains. It is low in salt, red meat, and added sugars.  Keep your sodium intake below 2,300 mg per day.  Getting at least 30-45 minutes of aerobic exercise at least 4 times per week.  Losing weight if necessary.  Not smoking.  Limiting alcoholic beverages.  Learning ways to reduce stress. Your health care provider may prescribe medicine if lifestyle changes are not enough to get your blood pressure under control, and if one of the following is true:  You are 51-19 years of age and your systolic blood pressure is above 140.  You are 24 years of age or older, and your systolic blood pressure is above 150.  Your diastolic blood pressure is above 90.  You have diabetes, and your systolic blood pressure is over [id number] or your diastolic blood pressure is over 90.  You have kidney disease and your blood pressure is above 140/90.  You have heart disease and your blood pressure is above 140/90. Your personal target blood pressure may vary depending on your medical conditions, your age, and other factors. Follow these instructions at home:  Have your blood pressure rechecked as directed by your health care provider.  Take medicines only as directed by your health care provider. Follow the directions carefully. Blood pressure medicines must be taken as prescribed. The medicine does not work as well when you skip doses. Skipping doses also puts you at risk for problems.  Do not smoke.  Monitor your blood pressure at home as directed by your health care provider. Contact a health care provider if:  You think you are having a reaction to medicines taken.  You have recurrent headaches or feel dizzy.  You have swelling in your ankles.  You have trouble with your vision. Get help right away if:  You develop a severe headache or confusion.  You have unusual weakness, numbness, or feel faint.  You have severe chest or abdominal  pain.  You vomit repeatedly.  You have trouble breathing. This information is not intended to replace advice given to you by your health care provider. Make sure you discuss any questions you have with your health care provider. Document Released: 07/20/2005 Document Revised: 12/26/2015 Document Reviewed: 05/12/2013 Elsevier Interactive Patient Education  2017 [employer] Inc[employer]   Cough, Adult Coughing is a reflex that clears your throat and your airways. Coughing helps to heal and protect your lungs. It is normal to cough occasionally, but a cough that happens with other symptoms or lasts a long time may be a sign of a condition that needs treatment. A cough may last only 2-3 weeks (acute), or it may last longer than 8 weeks (chronic). What are the causes? Coughing is commonly caused by:  Breathing in substances that irritate your lungs.  A viral or bacterial respiratory infection.  Allergies.  Asthma.  Postnasal drip.  Smoking.  Acid backing up from the stomach into the esophagus (gastroesophageal reflux).  Certain medicines.  Chronic lung problems, including COPD (or rarely, lung cancer).  Other medical conditions such as heart failure. Follow these instructions at home: Pay attention to any changes in your symptoms. Take these actions to help with your discomfort:  Take medicines only as told by your health care provider.  If you were prescribed an antibiotic medicine, take it as told by your health care provider. Do not stop  taking the antibiotic even if you start to feel better.  Talk with your health care provider before you take a cough suppressant medicine.  Drink enough fluid to keep your urine clear or pale yellow.  If the air is dry, use a cold steam vaporizer or humidifier in your bedroom or your home to help loosen secretions.  Avoid anything that causes you to cough at work or at home.  If your cough is worse at night, try sleeping in a semi-upright  position.  Avoid cigarette smoke. If you smoke, quit smoking. If you need help quitting, ask your health care provider.  Avoid caffeine.  Avoid alcohol.  Rest as needed. Contact a health care provider if:  You have new symptoms.  You cough up pus.  Your cough does not get better after 2-3 weeks, or your cough gets worse.  You cannot control your cough with suppressant medicines and you are losing sleep.  You develop pain that is getting worse or pain that is not controlled with pain medicines.  You have a fever.  You have unexplained weight loss.  You have night sweats. Get help right away if:  You cough up blood.  You have difficulty breathing.  Your heartbeat is very fast. This information is not intended to replace advice given to you by your health care provider. Make sure you discuss any questions you have with your health care provider. Document Released: 01/16/2011 Document Revised: 12/26/2015 Document Reviewed: 09/26/2014 Elsevier Interactive Patient Education  [employer].   I personally performed the services described in this documentation, which was scribed in my presence. The recorded information has been reviewed and considered for accuracy and completeness, addended by me as needed, and agree with information above.  Signed,   Gerald Ray, MD [facility].  09/21/16 11:42 AM

## 2016-09-22 LAB — BASIC METABOLIC PANEL
BUN / CREAT RATIO: 11 (ref 10–24)
BUN: 16 mg/dL (ref 8–27)
CO2: 24 mmol/L (ref 18–29)
CREATININE: 1.42 mg/dL — AB (ref 0.76–1.27)
Calcium: 9.6 mg/dL (ref 8.6–10.2)
Chloride: 99 mmol/L (ref 96–106)
GFR, EST AFRICAN AMERICAN: 59 mL/min/{1.73_m2} — AB (ref >59)
GFR, EST NON AFRICAN AMERICAN: 51 mL/min/{1.73_m2} — AB (ref >59)
Glucose: 108 mg/dL — ABNORMAL HIGH (ref 65–99)
Potassium: 4.2 mmol/L (ref 3.5–5.2)
SODIUM: 141 mmol/L (ref 134–144)

## 2016-10-04 IMAGING — CR DG FOOT 2V*R*
2 series · 2 of 2 positions shown · non-contrast
Comparison: None.

CLINICAL DATA: Right foot swelling and pain. Postop from right foot
surgery. Gout. Podagra.

EXAM:
RIGHT FOOT - 2 VIEW

[AP]
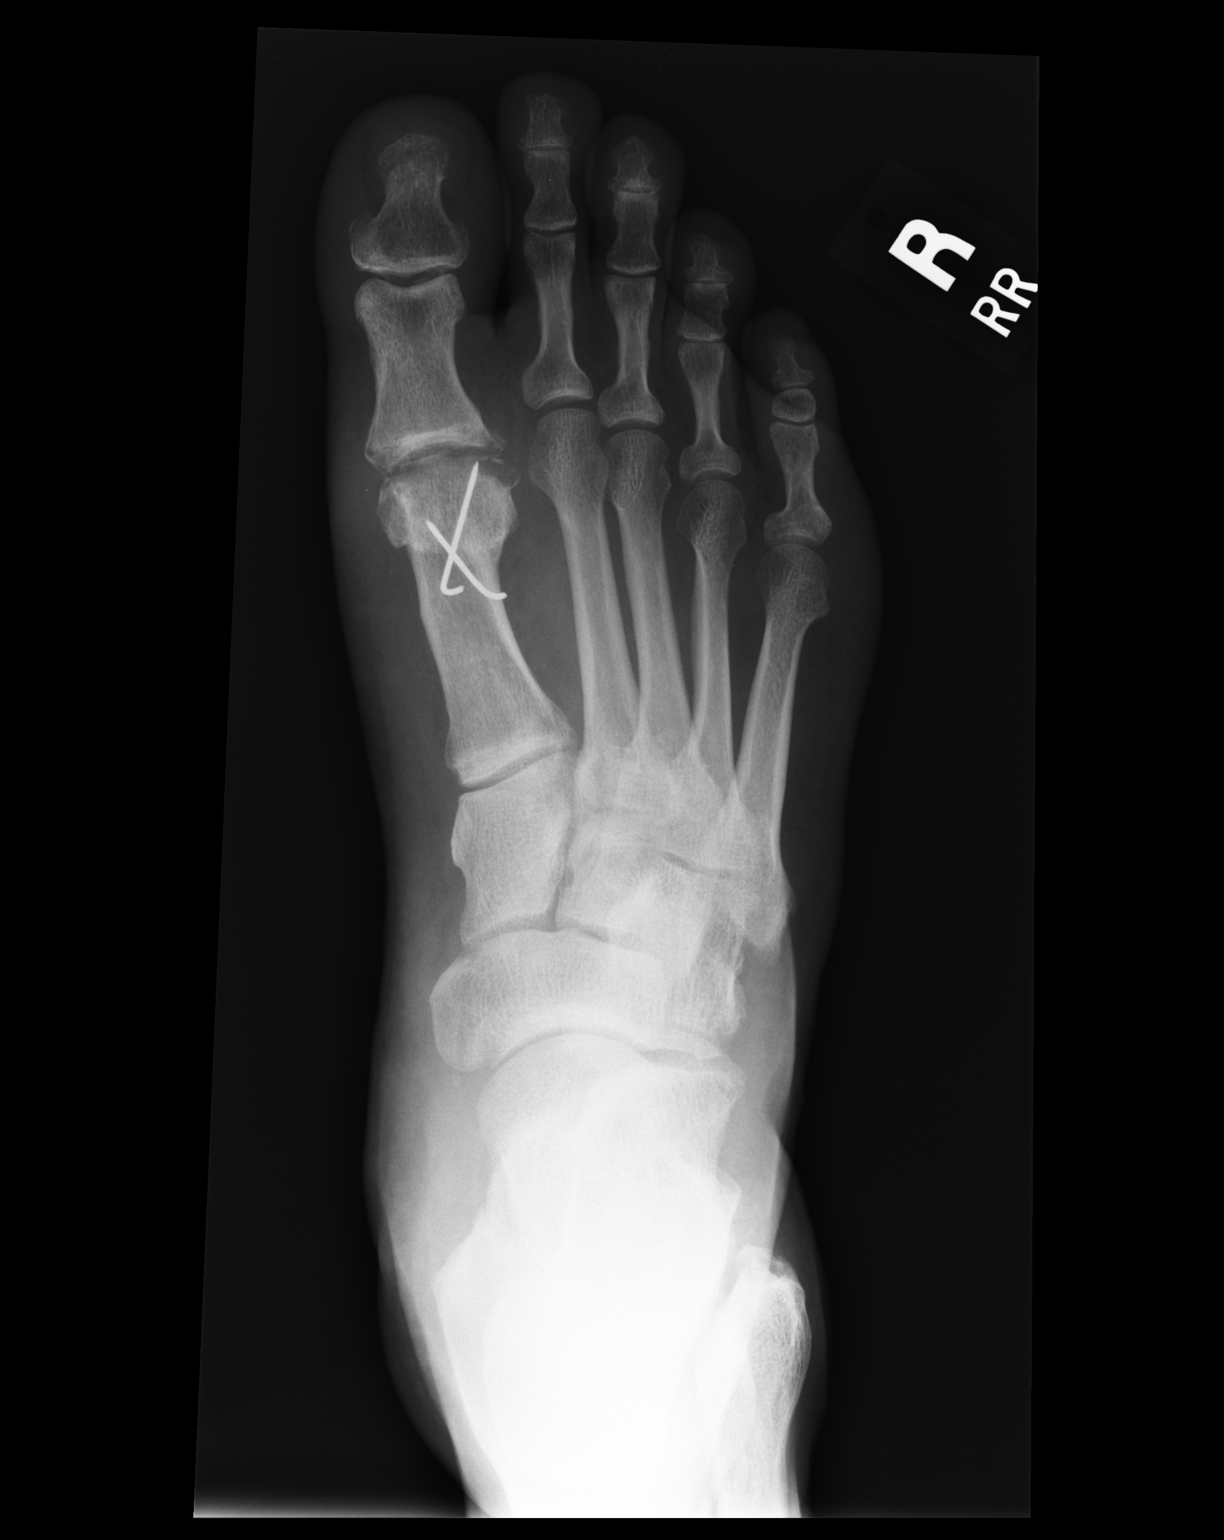

[lateral]
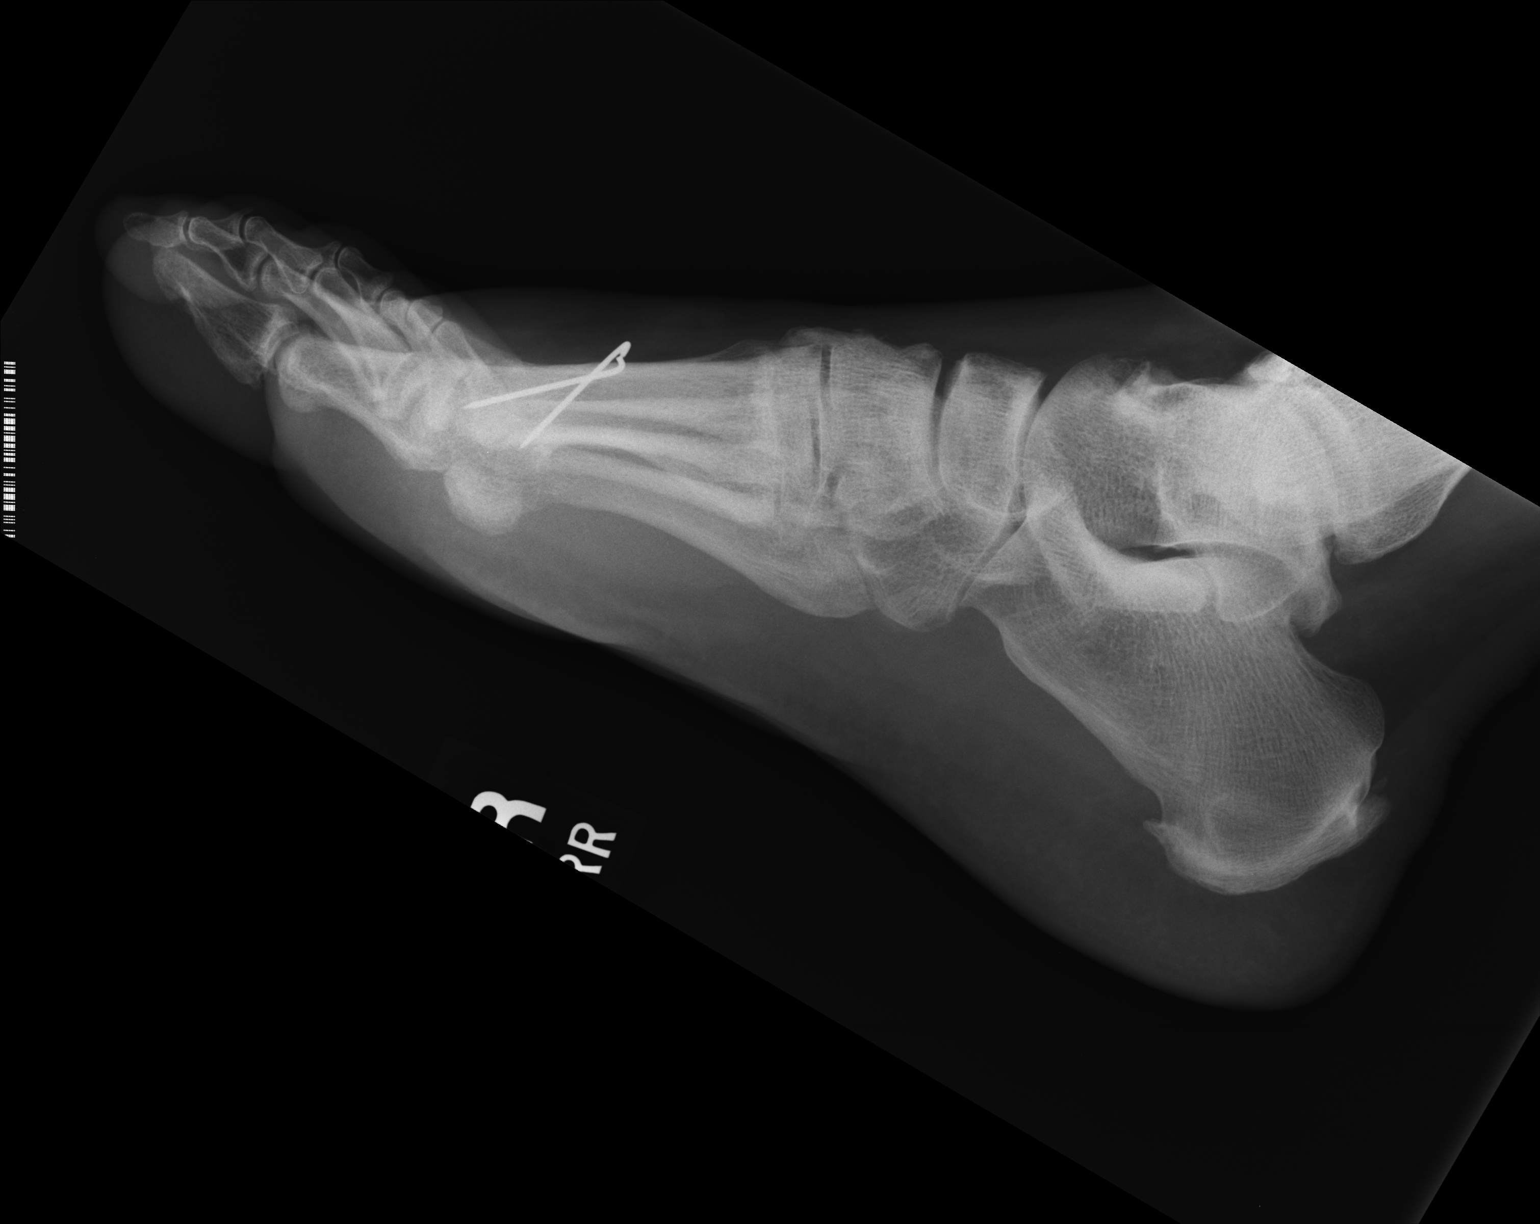

[2 of 2 positions shown; findings below may reference images not displayed]

FINDINGS: Crossed K-wires are seen in the distal first metatarsal. Mild
osteoarthritis is seen involving the first MTP joint. No evidence of
fracture or dislocation. Mild soft tissue swelling seen at first MTP
joint. Plantar and dorsal calcaneal bone spurs also noted.
IMPRESSION: Postop changes involving distal first metatarsal with mild first MTP
joint osteoarthritis and soft tissue swelling.

No evidence of fracture or dislocation.

## 2016-11-10 DIAGNOSIS — H524 Presbyopia: Secondary | ICD-10-CM | POA: Diagnosis not present

## 2017-02-10 ENCOUNTER — Other Ambulatory Visit: Payer: Self-pay | Admitting: Emergency Medicine

## 2017-02-10 DIAGNOSIS — I10 Essential (primary) hypertension: Secondary | ICD-10-CM

## 2017-03-11 ENCOUNTER — Encounter: Payer: Self-pay | Admitting: Emergency Medicine

## 2017-03-11 ENCOUNTER — Ambulatory Visit (INDEPENDENT_AMBULATORY_CARE_PROVIDER_SITE_OTHER): Payer: Medicare HMO | Admitting: Emergency Medicine

## 2017-03-11 VITALS — BP 187/77 | HR 58 | Temp 97.6°F | Resp 18 | Ht 70.0 in | Wt 202.0 lb

## 2017-03-11 DIAGNOSIS — I10 Essential (primary) hypertension: Secondary | ICD-10-CM | POA: Diagnosis not present

## 2017-03-11 DIAGNOSIS — Z1211 Encounter for screening for malignant neoplasm of colon: Secondary | ICD-10-CM | POA: Insufficient documentation

## 2017-03-11 DIAGNOSIS — Z131 Encounter for screening for diabetes mellitus: Secondary | ICD-10-CM

## 2017-03-11 MED ORDER — LISINOPRIL 10 MG PO TABS: 10.0000 mg | ORAL_TABLET | Freq: Every day | ORAL | 3 refills | Status: DC

## 2017-03-11 NOTE — Patient Instructions (Addendum)
IF you received an x-ray today, you will receive an invoice from [facility]. Please contact [facility] at [phone] with questions or concerns regarding your invoice.   IF you received labwork today, you will receive an invoice from [facility]. Please contact [facility] at [phone] with questions or concerns regarding your invoice.   Our billing staff will not be able to assist you with questions regarding bills from these companies.  You will be contacted with the lab results as soon as they are available. The fastest way to get your results is to activate your My Chart account. Instructions are located on the last page of this paperwork. If you have not heard from [place] regarding the results in 2 weeks, please contact this office.     How to Take Your Blood Pressure You can take your blood pressure at home with a machine. You may need to check your blood pressure at home:  To check if you have high blood pressure (hypertension).  To check your blood pressure over time.  To make sure your blood pressure medicine is working.  Supplies needed: You will need a blood pressure machine, or monitor. You can buy one at a drugstore or online. When choosing one:  Choose one with an arm cuff.  Choose one that wraps around your upper arm. Only one finger should fit between your arm and the cuff.  Do not choose one that measures your blood pressure from your wrist or finger.  Your doctor can suggest a monitor. How to prepare Avoid these things for 30 minutes before checking your blood pressure:  Drinking caffeine.  Drinking alcohol.  Eating.  Smoking.  Exercising.  Five minutes before checking your blood pressure:  Pee.  Sit in a dining chair. Avoid sitting in a soft couch or armchair.  Be quiet. Do not talk.  How to take your blood pressure Follow the instructions that came with your machine. If you have a digital blood pressure monitor, these may  be the instructions: 1. Sit up straight. 2. Place your feet on the floor. Do not cross your ankles or legs. 3. Rest your left arm at the level of your heart. You may rest it on a table, desk, or chair. 4. Pull up your shirt sleeve. 5. Wrap the blood pressure cuff around the upper part of your left arm. The cuff should be 1 inch (2.5 cm) above your elbow. It is best to wrap the cuff around bare skin. 6. Fit the cuff snugly around your arm. You should be able to place only one finger between the cuff and your arm. 7. Put the cord inside the groove of your elbow. 8. Press the power button. 9. Sit quietly while the cuff fills with air and loses air. 10. Write down the numbers on the screen. 11. Wait 2-3 minutes and then repeat steps 1-10.  What do the numbers mean? Two numbers make up your blood pressure. The first number is called systolic pressure. The second is called diastolic pressure. An example of a blood pressure reading is "120 over 80" (or 120/80). If you are an adult and do not have a medical condition, use this guide to find out if your blood pressure is normal: Normal  First number: below 120.  Second number: below 80. Elevated  First number: 120-129.  Second number: below 80. Hypertension stage 1  First number: 130-139.  Second number: 80-89. Hypertension stage 2  First number: 140 or above.  Second number: 27 or above. Your blood pressure is above normal even if only the top or bottom number is above normal. Follow these instructions at home:  Check your blood pressure as often as your doctor tells you to.  Take your monitor to your next doctor's appointment. Your doctor will: ? Make sure you are using it correctly. ? Make sure it is working right.  Make sure you understand what your blood pressure numbers should be.  Tell your doctor if your medicines are causing side effects. Contact a doctor if:  Your blood pressure keeps being high. Get help right  away if:  Your first blood pressure number is higher than 180.  Your second blood pressure number is higher than 120. This information is not intended to replace advice given to you by your health care provider. Make sure you discuss any questions you have with your health care provider. Document Released: 07/02/2008 Document Revised: 06/17/2016 Document Reviewed: 12/27/2015 Elsevier Interactive Patient Education  2018 [employer].  Hypertension Hypertension is another name for high blood pressure. High blood pressure forces your heart to work harder to pump blood. This can cause problems over time. There are two numbers in a blood pressure reading. There is a top number (systolic) over a bottom number (diastolic). It is best to have a blood pressure below 120/80. Healthy choices can help lower your blood pressure. You may need medicine to help lower your blood pressure if:  Your blood pressure cannot be lowered with healthy choices.  Your blood pressure is higher than 130/80.  Follow these instructions at home: Eating and drinking  If directed, follow the DASH eating plan. This diet includes: ? Filling half of your plate at each meal with fruits and vegetables. ? Filling one quarter of your plate at each meal with whole grains. Whole grains include whole wheat pasta, brown rice, and whole grain bread. ? Eating or drinking low-fat dairy products, such as skim milk or low-fat yogurt. ? Filling one quarter of your plate at each meal with low-fat (lean) proteins. Low-fat proteins include fish, skinless chicken, eggs, beans, and tofu. ? Avoiding fatty meat, cured and processed meat, or chicken with skin. ? Avoiding premade or processed food.  Eat less than 1,500 mg of salt (sodium) a day.  Limit alcohol use to no more than 1 drink a day for nonpregnant women and 2 drinks a day for men. One drink equals 12 oz of beer, 5 oz of wine, or 1 oz of hard liquor. Lifestyle  Work with your  doctor to stay at a healthy weight or to lose weight. Ask your doctor what the best weight is for you.  Get at least 30 minutes of exercise that causes your heart to beat faster (aerobic exercise) most days of the week. This may include walking, swimming, or biking.  Get at least 30 minutes of exercise that strengthens your muscles (resistance exercise) at least 3 days a week. This may include lifting weights or pilates.  Do not use any products that contain nicotine or tobacco. This includes cigarettes and e-cigarettes. If you need help quitting, ask your doctor.  Check your blood pressure at home as told by your doctor.  Keep all follow-up visits as told by your doctor. This is important. Medicines  Take over-the-counter and prescription medicines only as told by your doctor. Follow directions carefully.  Do not skip doses of blood pressure medicine. The medicine does not work as well if you skip doses.  Skipping doses also puts you at risk for problems.  Ask your doctor about side effects or reactions to medicines that you should watch for. Contact a doctor if:  You think you are having a reaction to the medicine you are taking.  You have headaches that keep coming back (recurring).  You feel dizzy.  You have swelling in your ankles.  You have trouble with your vision. Get help right away if:  You get a very bad headache.  You start to feel confused.  You feel weak or numb.  You feel faint.  You get very bad pain in your: ? Chest. ? Belly (abdomen).  You throw up (vomit) more than once.  You have trouble breathing. Summary  Hypertension is another name for high blood pressure.  Making healthy choices can help lower blood pressure. If your blood pressure cannot be controlled with healthy choices, you may need to take medicine. This information is not intended to replace advice given to you by your health care provider. Make sure you discuss any questions you have  with your health care provider. Document Released: 01/06/2008 Document Revised: 06/17/2016 Document Reviewed: 06/17/2016 Elsevier Interactive Patient Education  [employer].

## 2017-03-11 NOTE — Progress Notes (Signed)
Gerald Fisher 66 y.o.   Chief Complaint  Patient presents with  . Medication Refill    Lisinopril 90 DAY SUPPLY    HISTORY OF PRESENT ILLNESS: This is a 66 y.o. male here for HTN f/u and medication refill; doing well but has been off medication x 3-4 days; no complaints or medical concerns. Stays physically active and is eating well.  HPI   Prior to Admission medications   Medication Sig Start Date End Date Taking? Authorizing Provider  aspirin EC 81 MG tablet Take 81 mg by mouth daily.   Yes [provider]  lisinopril (PRINIVIL,ZESTRIL) 10 MG tablet Take 1 tablet (10 mg total) by mouth daily. 03/11/17 06/09/17 Yes SagardiaInes Bloomer, MD    No Known Allergies  Patient Active Problem List   Diagnosis Date Noted  . 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 Essential hypertension 05/24/2015    No past medical history on file.  Past Surgical History:  Procedure Laterality Date  . FOOT SURGERY      Social History   Social History  . Marital status: Single    Spouse name: N/A  . Number of children: N/A  . Years of education: N/A   Occupational History  . Not on file.   Social History Main Topics  . Smoking status: Never Smoker  . Smokeless tobacco: Never Used  . Alcohol use Yes  . Drug use: No  . Sexual activity: Yes    Birth control/ protection: Condom   Other Topics Concern  . Not on file   Social History Narrative  . No narrative on file    No family history on file.   Review of Systems  Constitutional: Negative.  Negative for chills, fever and weight loss.  HENT: Negative.   Eyes: Negative.  Negative for blurred vision and double vision.  Respiratory: Negative.  Negative for hemoptysis and shortness of breath.   Cardiovascular: Negative.  Negative for chest pain, palpitations, claudication and leg swelling.  Gastrointestinal: Negative.  Negative for abdominal pain, diarrhea, heartburn, nausea and vomiting.  Genitourinary:  Negative.  Negative for dysuria and hematuria.  Musculoskeletal: Negative.  Negative for back pain, myalgias and neck pain.  Skin: Negative.  Negative for rash.  Neurological: Negative.  Negative for dizziness and headaches.  Endo/Heme/Allergies: Negative.   All other systems reviewed and are negative.    Vitals:   03/11/17 0813 03/11/17 0814  BP: (!) 179/81 (!) 187/77  Pulse: (!) 58   Resp: 18   Temp: 97.6 F (36.4 C)   SpO2: 100%      Physical Exam  Constitutional: He is oriented to person, place, and time. He appears well-developed and well-nourished.  HENT:  Head: Normocephalic and atraumatic.  Right Ear: External ear normal.  Left Ear: External ear normal.  Nose: Nose normal.  Mouth/Throat: Oropharynx is clear and moist.  Eyes: Pupils are equal, round, and reactive to light. Conjunctivae and EOM are normal.  Neck: Normal range of motion. Neck supple. No JVD present. Carotid bruit is not present. No thyromegaly present.  Cardiovascular: Normal rate, regular rhythm, normal heart sounds and intact distal pulses.   Pulmonary/Chest: Effort normal and breath sounds normal.  Abdominal: Soft. Bowel sounds are normal. He exhibits no distension. There is no tenderness.  Musculoskeletal: Normal range of motion.  Lymphadenopathy:    He has no cervical adenopathy.  Neurological: He is alert and oriented to person, place, and time. No sensory deficit. He exhibits normal muscle  tone.  Skin: Skin is warm and dry. Capillary refill takes less than 2 seconds. No rash noted.  Psychiatric: He has a normal mood and affect. His behavior is normal.  Vitals reviewed.    ASSESSMENT & PLAN: Gerald Fisher was seen today for medication refill.  Diagnoses and all orders for this visit:  Essential hypertension -     lisinopril (PRINIVIL,ZESTRIL) 10 MG tablet; Take 1 tablet (10 mg total) by mouth daily. -     CBC with Differential/Platelet -     Comprehensive metabolic panel -     Lipid panel -      Hemoglobin A1c -     Hepatitis C antibody  Colon cancer screening -     Ambulatory referral to Gastroenterology    Patient Instructions       IF you received an x-ray today, you will receive an invoice from [facility]. Please contact [facility] at [phone] with questions or concerns regarding your invoice.   IF you received labwork today, you will receive an invoice from [facility]. Please contact [facility] at [phone] with questions or concerns regarding your invoice.   Our billing staff will not be able to assist you with questions regarding bills from these companies.  You will be contacted with the lab results as soon as they are available. The fastest way to get your results is to activate your My Chart account. Instructions are located on the last page of this paperwork. If you have not heard from [place] regarding the results in 2 weeks, please contact this office.     How to Take Your Blood Pressure You can take your blood pressure at home with a machine. You may need to check your blood pressure at home:  To check if you have high blood pressure (hypertension).  To check your blood pressure over time.  To make sure your blood pressure medicine is working.  Supplies needed: You will need a blood pressure machine, or monitor. You can buy one at a drugstore or online. When choosing one:  Choose one with an arm cuff.  Choose one that wraps around your upper arm. Only one finger should fit between your arm and the cuff.  Do not choose one that measures your blood pressure from your wrist or finger.  Your doctor can suggest a monitor. How to prepare Avoid these things for 30 minutes before checking your blood pressure:  Drinking caffeine.  Drinking alcohol.  Eating.  Smoking.  Exercising.  Five minutes before checking your blood pressure:  Pee.  Sit in a dining chair. Avoid sitting in a soft couch or armchair.  Be quiet. Do not  talk.  How to take your blood pressure Follow the instructions that came with your machine. If you have a digital blood pressure monitor, these may be the instructions: 1. Sit up straight. 2. Place your feet on the floor. Do not cross your ankles or legs. 3. Rest your left arm at the level of your heart. You may rest it on a table, desk, or chair. 4. Pull up your shirt sleeve. 5. Wrap the blood pressure cuff around the upper part of your left arm. The cuff should be 1 inch (2.5 cm) above your elbow. It is best to wrap the cuff around bare skin. 6. Fit the cuff snugly around your arm. You should be able to place only one finger between the cuff and your arm. 7. Put the cord inside the groove of your elbow. 8. Press  the power button. 9. Sit quietly while the cuff fills with air and loses air. 10. Write down the numbers on the screen. 11. Wait 2-3 minutes and then repeat steps 1-10.  What do the numbers mean? Two numbers make up your blood pressure. The first number is called systolic pressure. The second is called diastolic pressure. An example of a blood pressure reading is "120 over 80" (or 120/80). If you are an adult and do not have a medical condition, use this guide to find out if your blood pressure is normal: Normal  First number: below 120.  Second number: below 80. Elevated  First number: 120-129.  Second number: below 80. Hypertension stage 1  First number: 130-139.  Second number: 80-89. Hypertension stage 2  First number: 140 or above.  Second number: 51 or above. Your blood pressure is above normal even if only the top or bottom number is above normal. Follow these instructions at home:  Check your blood pressure as often as your doctor tells you to.  Take your monitor to your next doctor's appointment. Your doctor will: ? Make sure you are using it correctly. ? Make sure it is working right.  Make sure you understand what your blood pressure numbers should  be.  Tell your doctor if your medicines are causing side effects. Contact a doctor if:  Your blood pressure keeps being high. Get help right away if:  Your first blood pressure number is higher than 180.  Your second blood pressure number is higher than 120. This information is not intended to replace advice given to you by your health care provider. Make sure you discuss any questions you have with your health care provider. Document Released: 07/02/2008 Document Revised: 06/17/2016 Document Reviewed: 12/27/2015 Elsevier Interactive Patient Education  2018 [employer].  Hypertension Hypertension is another name for high blood pressure. High blood pressure forces your heart to work harder to pump blood. This can cause problems over time. There are two numbers in a blood pressure reading. There is a top number (systolic) over a bottom number (diastolic). It is best to have a blood pressure below 120/80. Healthy choices can help lower your blood pressure. You may need medicine to help lower your blood pressure if:  Your blood pressure cannot be lowered with healthy choices.  Your blood pressure is higher than 130/80.  Follow these instructions at home: Eating and drinking  If directed, follow the DASH eating plan. This diet includes: ? Filling half of your plate at each meal with fruits and vegetables. ? Filling one quarter of your plate at each meal with whole grains. Whole grains include whole wheat pasta, brown rice, and whole grain bread. ? Eating or drinking low-fat dairy products, such as skim milk or low-fat yogurt. ? Filling one quarter of your plate at each meal with low-fat (lean) proteins. Low-fat proteins include fish, skinless chicken, eggs, beans, and tofu. ? Avoiding fatty meat, cured and processed meat, or chicken with skin. ? Avoiding premade or processed food.  Eat less than 1,500 mg of salt (sodium) a day.  Limit alcohol use to no more than 1 drink a day for  nonpregnant women and 2 drinks a day for men. One drink equals 12 oz of beer, 5 oz of wine, or 1 oz of hard liquor. Lifestyle  Work with your doctor to stay at a healthy weight or to lose weight. Ask your doctor what the best weight is for you.  Get at least  30 minutes of exercise that causes your heart to beat faster (aerobic exercise) most days of the week. This may include walking, swimming, or biking.  Get at least 30 minutes of exercise that strengthens your muscles (resistance exercise) at least 3 days a week. This may include lifting weights or pilates.  Do not use any products that contain nicotine or tobacco. This includes cigarettes and e-cigarettes. If you need help quitting, ask your doctor.  Check your blood pressure at home as told by your doctor.  Keep all follow-up visits as told by your doctor. This is important. Medicines  Take over-the-counter and prescription medicines only as told by your doctor. Follow directions carefully.  Do not skip doses of blood pressure medicine. The medicine does not work as well if you skip doses. Skipping doses also puts you at risk for problems.  Ask your doctor about side effects or reactions to medicines that you should watch for. Contact a doctor if:  You think you are having a reaction to the medicine you are taking.  You have headaches that keep coming back (recurring).  You feel dizzy.  You have swelling in your ankles.  You have trouble with your vision. Get help right away if:  You get a very bad headache.  You start to feel confused.  You feel weak or numb.  You feel faint.  You get very bad pain in your: ? Chest. ? Belly (abdomen).  You throw up (vomit) more than once.  You have trouble breathing. Summary  Hypertension is another name for high blood pressure.  Making healthy choices can help lower blood pressure. If your blood pressure cannot be controlled with healthy choices, you may need to take  medicine. This information is not intended to replace advice given to you by your health care provider. Make sure you discuss any questions you have with your health care provider. Document Released: 01/06/2008 Document Revised: 06/17/2016 Document Reviewed: 06/17/2016 Elsevier Interactive Patient Education  2018 [employer]      [name], MD [facility]

## 2017-03-12 ENCOUNTER — Encounter: Payer: Self-pay | Admitting: Radiology

## 2017-03-12 LAB — CBC WITH DIFFERENTIAL/PLATELET
BASOS: 1 %
Basophils Absolute: 0 10*3/uL (ref 0.0–0.2)
EOS (ABSOLUTE): 0.1 10*3/uL (ref 0.0–0.4)
Eos: 2 %
HEMOGLOBIN: 15.3 g/dL (ref 13.0–17.7)
Hematocrit: 45.6 % (ref 37.5–51.0)
IMMATURE GRANS (ABS): 0 10*3/uL (ref 0.0–0.1)
IMMATURE GRANULOCYTES: 0 %
LYMPHS: 44 %
Lymphocytes Absolute: 1.8 10*3/uL (ref 0.7–3.1)
MCH: 31.6 pg (ref 26.6–33.0)
MCHC: 33.6 g/dL (ref 31.5–35.7)
MCV: 94 fL (ref 79–97)
Monocytes Absolute: 0.3 10*3/uL (ref 0.1–0.9)
Monocytes: 8 %
NEUTROS ABS: 1.9 10*3/uL (ref 1.4–7.0)
NEUTROS PCT: 45 %
PLATELETS: 209 10*3/uL (ref 150–379)
RBC: 4.84 x10E6/uL (ref 4.14–5.80)
RDW: 14.3 % (ref 12.3–15.4)
WBC: 4.1 10*3/uL (ref 3.4–10.8)

## 2017-03-12 LAB — COMPREHENSIVE METABOLIC PANEL
A/G RATIO: 1.7 (ref 1.2–2.2)
ALBUMIN: 4.3 g/dL (ref 3.6–4.8)
ALK PHOS: 71 IU/L (ref 39–117)
ALT: 14 IU/L (ref 0–44)
AST: 15 IU/L (ref 0–40)
BILIRUBIN TOTAL: 0.7 mg/dL (ref 0.0–1.2)
BUN / CREAT RATIO: 12 (ref 10–24)
BUN: 17 mg/dL (ref 8–27)
CO2: 24 mmol/L (ref 20–29)
Calcium: 9.3 mg/dL (ref 8.6–10.2)
Chloride: 102 mmol/L (ref 96–106)
Creatinine, Ser: 1.42 mg/dL — ABNORMAL HIGH (ref 0.76–1.27)
GFR calc non Af Amer: 51 mL/min/{1.73_m2} — ABNORMAL LOW (ref >59)
GFR, EST AFRICAN AMERICAN: 59 mL/min/{1.73_m2} — AB (ref >59)
Globulin, Total: 2.5 g/dL (ref 1.5–4.5)
Glucose: 103 mg/dL — ABNORMAL HIGH (ref 65–99)
POTASSIUM: 4.1 mmol/L (ref 3.5–5.2)
Sodium: 142 mmol/L (ref 134–144)
Total Protein: 6.8 g/dL (ref 6.0–8.5)

## 2017-03-12 LAB — LIPID PANEL
CHOL/HDL RATIO: 3.3 ratio (ref 0.0–5.0)
Cholesterol, Total: 251 mg/dL — ABNORMAL HIGH (ref 100–199)
HDL: 77 mg/dL (ref >39)
LDL CALC: 155 mg/dL — AB (ref 0–99)
Triglycerides: 97 mg/dL (ref 0–149)
VLDL Cholesterol Cal: 19 mg/dL (ref 5–40)

## 2017-03-12 LAB — HEMOGLOBIN A1C
ESTIMATED AVERAGE GLUCOSE: 117 mg/dL
HEMOGLOBIN A1C: 5.7 % — AB (ref 4.8–5.6)

## 2017-03-12 LAB — HEPATITIS C ANTIBODY: Hep C Virus Ab: <0.1 s/co ratio (ref 0.0–0.9)

## 2017-04-27 DIAGNOSIS — Z1211 Encounter for screening for malignant neoplasm of colon: Secondary | ICD-10-CM | POA: Diagnosis not present

## 2017-04-27 DIAGNOSIS — D126 Benign neoplasm of colon, unspecified: Secondary | ICD-10-CM | POA: Diagnosis not present

## 2017-04-27 DIAGNOSIS — D123 Benign neoplasm of transverse colon: Secondary | ICD-10-CM | POA: Diagnosis not present

## 2017-04-27 DIAGNOSIS — K5730 Diverticulosis of large intestine without perforation or abscess without bleeding: Secondary | ICD-10-CM | POA: Diagnosis not present

## 2017-04-27 DIAGNOSIS — K648 Other hemorrhoids: Secondary | ICD-10-CM | POA: Diagnosis not present

## 2017-04-27 DIAGNOSIS — K635 Polyp of colon: Secondary | ICD-10-CM | POA: Diagnosis not present

## 2017-04-30 ENCOUNTER — Telehealth: Payer: Self-pay | Admitting: *Deleted

## 2017-04-30 ENCOUNTER — Telehealth: Payer: Self-pay | Admitting: Family Medicine

## 2017-04-30 MED ORDER — LISINOPRIL 20 MG PO TABS: 20.0000 mg | ORAL_TABLET | Freq: Every day | ORAL | 3 refills | Status: DC

## 2017-04-30 NOTE — Telephone Encounter (Signed)
PATIENT STATES THAT PHARMACY WANT REFILL HIS BLOOD PRESSURE MEDICINE EARLY APPARENTLY THE PATIENT HAD RECEIVED THE WRONG INFORMATION ON HOW TO TAKE MEDICATION BECAUSE PT WAS TAKING TWO PILLS A DAY INSTEAD OF ONE PILL NOW HE  ONLY HAVE TWO PILLS LEFT AND THE PHARMACY WANT REFILL HIS BLOOD PRESSURE MEDICINE BECAUSE ITS TO EARLY FOR MORE REFILLS

## 2017-04-30 NOTE — Telephone Encounter (Signed)
Per pt's note [facility], [address][phone] will not refill his BP med. Pt was taking 2 tabs instead of taking one tab daily. He is needing more. pls advise

## 2017-04-30 NOTE — Telephone Encounter (Signed)
Pt aware that BP rx has been sent to pharmacy.

## 2017-04-30 NOTE — Telephone Encounter (Signed)
Routed to [name]

## 2017-04-30 NOTE — Telephone Encounter (Signed)
New prescription sent to pharmacy. Thanks

## 2017-04-30 NOTE — Addendum Note (Signed)
Addended by: Davina Poke on: 04/30/2017 05:56 PM   Modules accepted: Orders

## 2018-02-17 DIAGNOSIS — H524 Presbyopia: Secondary | ICD-10-CM | POA: Diagnosis not present

## 2018-02-17 DIAGNOSIS — Z0100 Encounter for examination of eyes and vision without abnormal findings: Secondary | ICD-10-CM | POA: Diagnosis not present

## 2018-07-24 ENCOUNTER — Other Ambulatory Visit: Payer: Self-pay | Admitting: Emergency Medicine

## 2018-07-24 DIAGNOSIS — I10 Essential (primary) hypertension: Secondary | ICD-10-CM

## 2018-08-05 ENCOUNTER — Other Ambulatory Visit: Payer: Self-pay

## 2018-08-05 ENCOUNTER — Ambulatory Visit (INDEPENDENT_AMBULATORY_CARE_PROVIDER_SITE_OTHER): Payer: Medicare HMO | Admitting: Family Medicine

## 2018-08-05 ENCOUNTER — Encounter: Payer: Self-pay | Admitting: Family Medicine

## 2018-08-05 VITALS — BP 140/82 | HR 62 | Temp 98.2°F | Resp 16 | Ht 70.0 in | Wt 206.6 lb

## 2018-08-05 DIAGNOSIS — M109 Gout, unspecified: Secondary | ICD-10-CM

## 2018-08-05 DIAGNOSIS — M79672 Pain in left foot: Secondary | ICD-10-CM | POA: Diagnosis not present

## 2018-08-05 MED ORDER — PREDNISONE 20 MG PO TABS
40.0000 mg | ORAL_TABLET | Freq: Every day | ORAL | 0 refills | Status: DC
Start: 2018-08-05 — End: 2018-08-19

## 2018-08-05 MED ORDER — HYDROCODONE-ACETAMINOPHEN 5-325 MG PO TABS: 1.0000 | ORAL_TABLET | Freq: Four times a day (QID) | ORAL | 0 refills | Status: DC | PRN

## 2018-08-05 NOTE — Progress Notes (Signed)
Subjective:    Patient ID: Gerald Fisher, male    DOB: 1950/10/15, 68 y.o.   MRN: [id number]  HPI Gerald Fisher is a 68 y.o. male Presents today for: Chief Complaint  Patient presents with  . Gout    pain in the left foot 1.5 weeks otc aleve and ibuprofen has not been helping   Started with R foot pain and swelling a few weeks ago. Treated with otc ibuprofen and alleve. R side improved in a week- less swelling and pain.   L foot swelling and pain started about 5 days ago. Tried alleve and ibuprofen- min relief - still swollen.  Eating more seafood past few months, some liquor - few drinks per day, but stopped drinking past few weeks.   Hx of gout but had not had flairs in few years. Prior on allopurinol in 2017 - no recent meds. Old rx indomethacin but did not take.   Lab Results  Component Value Date   LABURIC 6.1 06/15/2015        Patient Active Problem List   Diagnosis Date Noted  . Colon cancer screening 03/11/2017  . 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 Essential hypertension 05/24/2015   No past medical history on file. Past Surgical History:  Procedure Laterality Date  . FOOT SURGERY     No Known Allergies Prior to Admission medications   Medication Sig Start Date End Date Taking? Authorizing Provider  aspirin EC 81 MG tablet Take 81 mg by mouth daily.   Yes [provider]  lisinopril (PRINIVIL,ZESTRIL) 20 MG tablet TAKE 1 TABLET BY MOUTH EVERY DAY 07/25/18  Yes Sagardia, Ines Bloomer, MD  naproxen sodium (ALEVE) 220 MG tablet Take 220 mg by mouth.   Yes [provider]   Social History   Socioeconomic History  . Marital status: Single    Spouse name: Not on file  . Number of children: Not on file  . Years of education: Not on file  . Highest education level: Not on file  Occupational History  . Not on file  Social Needs  . Financial resource strain: Not on file  . Food insecurity:    Worry: Not on file   Inability: Not on file  . Transportation needs:    Medical: Not on file    Non-medical: Not on file  Tobacco Use  . Smoking status: Never Smoker  . Smokeless tobacco: Never Used  Substance and Sexual Activity  . Alcohol use: Yes  . Drug use: No  . Sexual activity: Yes    Birth control/protection: Condom  Lifestyle  . Physical activity:    Days per week: Not on file    Minutes per session: Not on file  . Stress: Not on file  Relationships  . Social connections:    Talks on phone: Not on file    Gets together: Not on file    Attends religious service: Not on file    Active member of club or organization: Not on file    Attends meetings of clubs or organizations: Not on file    Relationship status: Not on file  . Intimate partner violence:    Fear of current or ex partner: Not on file    Emotionally abused: Not on file    Physically abused: Not on file    Forced sexual activity: Not on file  Other Topics Concern  . Not on file  Social History Narrative  . Not on  file    Review of Systems  Musculoskeletal: Positive for arthralgias, joint swelling and myalgias.    Per HPI.     Objective:   Physical Exam Vitals signs reviewed.  Constitutional:      General: He is not in acute distress.    Appearance: He is well-developed.  HENT:     Head: Normocephalic and atraumatic.  Cardiovascular:     Rate and Rhythm: Normal rate.  Pulmonary:     Effort: Pulmonary effort is normal.  Musculoskeletal:       Feet:  Skin:    General: Skin is warm.     Findings: Erythema (faint erythema dorsal foot to MTP. ) present.  Neurological:     Mental Status: He is alert and oriented to person, place, and time.    Vitals:   08/05/18 1623 08/05/18 1628  BP: (!) 181/93 140/82  Pulse: 62   Resp: 16   Temp: 98.2 F (36.8 C)   TempSrc: Oral   SpO2: 100%   Weight: 206 lb 9.6 oz (93.7 kg)   Height: 5\' 10"  (1.778 m)       Assessment & Plan:    Gerald Fisher is a 68 y.o.  male Gout of left foot, unspecified cause, unspecified chronicity - Plan: predniSONE (DELTASONE) 20 MG tablet, HYDROcodone-acetaminophen (NORCO/VICODIN) 5-325 MG tablet  Left foot pain - Plan: HYDROcodone-acetaminophen (NORCO/VICODIN) 5-325 MG tablet  -Appears consistent with gout flare.  Likely related to previous diet changes with seafood intake as well as alcohol intake.  Has since cut back on both.    -With insufficient treatment using NSAIDs will start prednisone, potential side effects and risk discussed.  Has follow-up in a few weeks, and can check uric acid at that time.  RTC/ER precautions if worse.  Meds ordered this encounter  Medications  . predniSONE (DELTASONE) 20 MG tablet    Sig: Take 2 tablets (40 mg total) by mouth daily with breakfast.    Dispense:  10 tablet    Refill:  0  . HYDROcodone-acetaminophen (NORCO/VICODIN) 5-325 MG tablet    Sig: Take 1 tablet by mouth every 6 (six) hours as needed for moderate pain.    Dispense:  20 tablet    Refill:  0   Patient Instructions   Foot pain appears to be due to gout.  Start prednisone 2 pills/day for the next 5 days.  Hydrocodone if needed for pain.  If any wounds, increasing or spreading redness, or worsening symptoms please be seen.  Follow-up in the next few weeks and we can recheck gout test at that time.  Thank you for coming in today.  Gout  Gout is a condition that causes painful swelling of the joints. Gout is a type of inflammation of the joints (arthritis). This condition is caused by having too much uric acid in the body. Uric acid is a chemical that forms when the body breaks down substances called purines. Purines are important for building body proteins. When the body has too much uric acid, sharp crystals can form and build up inside the joints. This causes pain and swelling. Gout attacks can happen quickly and may be very painful (acute gout). Over time, the attacks can affect more joints and become more frequent  (chronic gout). Gout can also cause uric acid to build up under the skin and inside the kidneys. What are the causes? This condition is caused by too much uric acid in your blood. This can happen because:  Your  kidneys do not remove enough uric acid from your blood. This is the most common cause.  Your body makes too much uric acid. This can happen with some cancers and cancer treatments. It can also occur if your body is breaking down too many red blood cells (hemolytic anemia).  You eat too many foods that are high in purines. These foods include organ meats and some seafood. Alcohol, especially beer, is also high in purines. A gout attack may be triggered by trauma or stress. What increases the risk? You are more likely to develop this condition if you:  Have a family history of gout.  Are male and middle-aged.  Are male and have gone through menopause.  Are obese.  Frequently drink alcohol, especially beer.  Are dehydrated.  Lose weight too quickly.  Have an organ transplant.  Have lead poisoning.  Take certain medicines, including aspirin, cyclosporine, diuretics, levodopa, and niacin.  Have kidney disease.  Have a skin condition called psoriasis. What are the signs or symptoms? An attack of acute gout happens quickly. It usually occurs in just one joint. The most common place is the big toe. Attacks often start at night. Other joints that may be affected include joints of the feet, ankle, knee, fingers, wrist, or elbow. Symptoms of this condition may include:  Severe pain.  Warmth.  Swelling.  Stiffness.  Tenderness. The affected joint may be very painful to touch.  Shiny, red, or purple skin.  Chills and fever. Chronic gout may cause symptoms more frequently. More joints may be involved. You may also have white or yellow lumps (tophi) on your hands or feet or in other areas near your joints. How is this diagnosed? This condition is diagnosed based on  your symptoms, medical history, and physical exam. You may have tests, such as:  Blood tests to measure uric acid levels.  Removal of joint fluid with a thin needle (aspiration) to look for uric acid crystals.  X-rays to look for joint damage. How is this treated? Treatment for this condition has two phases: treating an acute attack and preventing future attacks. Acute gout treatment may include medicines to reduce pain and swelling, including:  NSAIDs.  Steroids. These are strong anti-inflammatory medicines that can be taken by mouth (orally) or injected into a joint.  Colchicine. This medicine relieves pain and swelling when it is taken soon after an attack. It can be given by mouth or through an IV. Preventive treatment may include:  Daily use of smaller doses of NSAIDs or colchicine.  Use of a medicine that reduces uric acid levels in your blood.  Changes to your diet. You may need to see a dietitian about what to eat and drink to prevent gout. Follow these instructions at home: During a gout attack   If directed, put ice on the affected area: ? Put ice in a plastic bag. ? Place a towel between your skin and the bag. ? Leave the ice on for 20 minutes, 2-3 times a day.  Raise (elevate) the affected joint above the level of your heart as often as possible.  Rest the joint as much as possible. If the affected joint is in your leg, you may be given crutches to use.  Follow instructions from your health care provider about eating or drinking restrictions. Avoiding future gout attacks  Follow a low-purine diet as told by your dietitian or health care provider. Avoid foods and drinks that are high in purines, including liver, kidney, anchovies,  asparagus, herring, mushrooms, mussels, and beer.  Maintain a healthy weight or lose weight if you are overweight. If you want to lose weight, talk with your health care provider. It is important that you do not lose weight too  quickly.  Start or maintain an exercise program as told by your health care provider. Eating and drinking  Drink enough fluids to keep your urine pale yellow.  If you drink alcohol: ? Limit how much you use to:  0-1 drink a day for women.  0-2 drinks a day for men. ? Be aware of how much alcohol is in your drink. In the [place] one drink equals one 12 oz bottle of beer (355 mL) one 5 oz glass of wine (148 mL), or one 1 oz glass of hard liquor (44 mL). General instructions  Take over-the-counter and prescription medicines only as told by your health care provider.  Do not drive or use heavy machinery while taking prescription pain medicine.  Return to your normal activities as told by your health care provider. Ask your health care provider what activities are safe for you.  Keep all follow-up visits as told by your health care provider. This is important. Contact a health care provider if you have:  Another gout attack.  Continuing symptoms of a gout attack after 10 days of treatment.  Side effects from your medicines.  Chills or a fever.  Burning pain when you urinate.  Pain in your lower back or belly. Get help right away if you:  Have severe or uncontrolled pain.  Cannot urinate. Summary  Gout is painful swelling of the joints caused by inflammation.  The most common site of pain is the big toe, but it can affect other joints in the body.  Medicines and dietary changes can help to prevent and treat gout attacks. This information is not intended to replace advice given to you by your health care provider. Make sure you discuss any questions you have with your health care provider. Document Released: 07/17/2000 Document Revised: 02/09/2018 Document Reviewed: 02/09/2018 Elsevier Interactive Patient Education  [employer].   If you have lab work done today you will be contacted with your lab results within the next 2 weeks.  If you have not heard from [place] then  please contact us. The fastest way to get your results is to register for My Chart.   IF you received an x-ray today, you will receive an invoice from [facility]. Please contact [facility] at [phone] with questions or concerns regarding your invoice.   IF you received labwork today, you will receive an invoice from [place]. Please contact [facility] at [phone] with questions or concerns regarding your invoice.   Our billing staff will not be able to assist you with questions regarding bills from these companies.  You will be contacted with the lab results as soon as they are available. The fastest way to get your results is to activate your My Chart account. Instructions are located on the last page of this paperwork. If you have not heard from [place] regarding the results in 2 weeks, please contact this office.      Signed,   [name], MD [facility].  08/06/18 3:57 PM

## 2018-08-05 NOTE — Patient Instructions (Addendum)
Foot pain appears to be due to gout.  Start prednisone 2 pills/day for the next 5 days.  Hydrocodone if needed for pain.  If any wounds, increasing or spreading redness, or worsening symptoms please be seen.  Follow-up in the next few weeks and we can recheck gout test at that time.  Thank you for coming in today.  Gout  Gout is a condition that causes painful swelling of the joints. Gout is a type of inflammation of the joints (arthritis). This condition is caused by having too much uric acid in the body. Uric acid is a chemical that forms when the body breaks down substances called purines. Purines are important for building body proteins. When the body has too much uric acid, sharp crystals can form and build up inside the joints. This causes pain and swelling. Gout attacks can happen quickly and may be very painful (acute gout). Over time, the attacks can affect more joints and become more frequent (chronic gout). Gout can also cause uric acid to build up under the skin and inside the kidneys. What are the causes? This condition is caused by too much uric acid in your blood. This can happen because:  Your kidneys do not remove enough uric acid from your blood. This is the most common cause.  Your body makes too much uric acid. This can happen with some cancers and cancer treatments. It can also occur if your body is breaking down too many red blood cells (hemolytic anemia).  You eat too many foods that are high in purines. These foods include organ meats and some seafood. Alcohol, especially beer, is also high in purines. A gout attack may be triggered by trauma or stress. What increases the risk? You are more likely to develop this condition if you:  Have a family history of gout.  Are male and middle-aged.  Are male and have gone through menopause.  Are obese.  Frequently drink alcohol, especially beer.  Are dehydrated.  Lose weight too quickly.  Have an organ  transplant.  Have lead poisoning.  Take certain medicines, including aspirin, cyclosporine, diuretics, levodopa, and niacin.  Have kidney disease.  Have a skin condition called psoriasis. What are the signs or symptoms? An attack of acute gout happens quickly. It usually occurs in just one joint. The most common place is the big toe. Attacks often start at night. Other joints that may be affected include joints of the feet, ankle, knee, fingers, wrist, or elbow. Symptoms of this condition may include:  Severe pain.  Warmth.  Swelling.  Stiffness.  Tenderness. The affected joint may be very painful to touch.  Shiny, red, or purple skin.  Chills and fever. Chronic gout may cause symptoms more frequently. More joints may be involved. You may also have white or yellow lumps (tophi) on your hands or feet or in other areas near your joints. How is this diagnosed? This condition is diagnosed based on your symptoms, medical history, and physical exam. You may have tests, such as:  Blood tests to measure uric acid levels.  Removal of joint fluid with a thin needle (aspiration) to look for uric acid crystals.  X-rays to look for joint damage. How is this treated? Treatment for this condition has two phases: treating an acute attack and preventing future attacks. Acute gout treatment may include medicines to reduce pain and swelling, including:  NSAIDs.  Steroids. These are strong anti-inflammatory medicines that can be taken by mouth (orally) or injected into  a joint.  Colchicine. This medicine relieves pain and swelling when it is taken soon after an attack. It can be given by mouth or through an IV. Preventive treatment may include:  Daily use of smaller doses of NSAIDs or colchicine.  Use of a medicine that reduces uric acid levels in your blood.  Changes to your diet. You may need to see a dietitian about what to eat and drink to prevent gout. Follow these instructions at  home: During a gout attack   If directed, put ice on the affected area: ? Put ice in a plastic bag. ? Place a towel between your skin and the bag. ? Leave the ice on for 20 minutes, 2-3 times a day.  Raise (elevate) the affected joint above the level of your heart as often as possible.  Rest the joint as much as possible. If the affected joint is in your leg, you may be given crutches to use.  Follow instructions from your health care provider about eating or drinking restrictions. Avoiding future gout attacks  Follow a low-purine diet as told by your dietitian or health care provider. Avoid foods and drinks that are high in purines, including liver, kidney, anchovies, asparagus, herring, mushrooms, mussels, and beer.  Maintain a healthy weight or lose weight if you are overweight. If you want to lose weight, talk with your health care provider. It is important that you do not lose weight too quickly.  Start or maintain an exercise program as told by your health care provider. Eating and drinking  Drink enough fluids to keep your urine pale yellow.  If you drink alcohol: ? Limit how much you use to:  0-1 drink a day for women.  0-2 drinks a day for men. ? Be aware of how much alcohol is in your drink. In the [place] one drink equals one 12 oz bottle of beer (355 mL) one 5 oz glass of wine (148 mL), or one 1 oz glass of hard liquor (44 mL). General instructions  Take over-the-counter and prescription medicines only as told by your health care provider.  Do not drive or use heavy machinery while taking prescription pain medicine.  Return to your normal activities as told by your health care provider. Ask your health care provider what activities are safe for you.  Keep all follow-up visits as told by your health care provider. This is important. Contact a health care provider if you have:  Another gout attack.  Continuing symptoms of a gout attack after 10 days of  treatment.  Side effects from your medicines.  Chills or a fever.  Burning pain when you urinate.  Pain in your lower back or belly. Get help right away if you:  Have severe or uncontrolled pain.  Cannot urinate. Summary  Gout is painful swelling of the joints caused by inflammation.  The most common site of pain is the big toe, but it can affect other joints in the body.  Medicines and dietary changes can help to prevent and treat gout attacks. This information is not intended to replace advice given to you by your health care provider. Make sure you discuss any questions you have with your health care provider. Document Released: 07/17/2000 Document Revised: 02/09/2018 Document Reviewed: 02/09/2018 Elsevier Interactive Patient Education  [employer].   If you have lab work done today you will be contacted with your lab results within the next 2 weeks.  If you have not heard from [place] then  please contact us. The fastest way to get your results is to register for My Chart.   IF you received an x-ray today, you will receive an invoice from [facility]. Please contact [facility] at [phone] with questions or concerns regarding your invoice.   IF you received labwork today, you will receive an invoice from [place]. Please contact [facility] at [phone] with questions or concerns regarding your invoice.   Our billing staff will not be able to assist you with questions regarding bills from these companies.  You will be contacted with the lab results as soon as they are available. The fastest way to get your results is to activate your My Chart account. Instructions are located on the last page of this paperwork. If you have not heard from [place] regarding the results in 2 weeks, please contact this office.

## 2018-08-16 ENCOUNTER — Other Ambulatory Visit: Payer: Self-pay | Admitting: Family Medicine

## 2018-08-16 ENCOUNTER — Telehealth: Payer: Self-pay | Admitting: Emergency Medicine

## 2018-08-16 DIAGNOSIS — M109 Gout, unspecified: Secondary | ICD-10-CM

## 2018-08-16 NOTE — Telephone Encounter (Signed)
Copied from [place] [phone]. Topic: General - Other >> Aug 16, 2018  2:21 PM Windy Kalata wrote: Reason for CRM: patient is calling back from a voicemail he received from someone in office for his Prednisone refill, all clinical were not available  Please call back at [phone]

## 2018-08-16 NOTE — Telephone Encounter (Signed)
Attempted to contact pt regarding symptoms; left message on voicemail [phone]; prednisone prescribed at office visit on 08/05/2018 by Dr Carlota Raspberry; pt has follow up appointment with Dr Mitchel Honour on 08/29/2018; will route to office for final disposition.  Requested medication (s) are due for refill today: no  Requested medication (s) are on the active medication list: yes  Last refill:  08/05/2018  Future visit scheduled: yes  Notes to clinic:  Not delegated

## 2018-08-18 NOTE — Telephone Encounter (Signed)
Pt. States he is having swelling and pain in both Great toes, "and I would appreciate a refill of the Prednisone."

## 2018-08-18 NOTE — Telephone Encounter (Signed)
Pt is schedule to come in tomorrow at 3 and see Dr Carlota Raspberry

## 2018-08-18 NOTE — Telephone Encounter (Signed)
Due to timing of last visit (11 days ago), I would recommend a recheck - same day with me in next 2 days is fine. That will allow me to determine if repeat prednisone or longer course needed. Thanks.

## 2018-08-18 NOTE — Telephone Encounter (Signed)
Patient stated that he received a call and there was no VM.  He is returning the call and would like a call back.  Please leave a detailed VM.  CB# [phone]

## 2018-08-18 NOTE — Telephone Encounter (Signed)
I have attempted to call pt back after hearing that he has hung up with the [facility], however there was no answer.   The plan is to relay to the pt that we are unable to refill the prednisone medication for he has to be evaluated to see if the medication is appropriate and if so how much and how long does he need it for. A same day appointment would be okay in the next two day with Dr. Carlota Raspberry is fine to get scheduled and he should still keep his appointment with his PCP on 08/29/18.   Thanks, [employer]

## 2018-08-19 ENCOUNTER — Encounter: Payer: Self-pay | Admitting: Family Medicine

## 2018-08-19 ENCOUNTER — Ambulatory Visit (INDEPENDENT_AMBULATORY_CARE_PROVIDER_SITE_OTHER): Payer: Medicare HMO | Admitting: Family Medicine

## 2018-08-19 ENCOUNTER — Other Ambulatory Visit: Payer: Self-pay

## 2018-08-19 VITALS — BP 137/82 | HR 95 | Temp 98.0°F | Resp 18 | Ht 70.0 in | Wt 202.0 lb

## 2018-08-19 DIAGNOSIS — M109 Gout, unspecified: Secondary | ICD-10-CM

## 2018-08-19 MED ORDER — COLCHICINE 0.6 MG PO TABS: 1.2000 mg | ORAL_TABLET | Freq: Once | ORAL | 1 refills | Status: DC

## 2018-08-19 MED ORDER — PREDNISONE 20 MG PO TABS: ORAL_TABLET | ORAL | 0 refills | Status: DC

## 2018-08-19 NOTE — Patient Instructions (Addendum)
  Foot pain still appears to be due to gout.  Okay to restart prednisone at 40 mg/day for a few days, then taper should lessen risk of recurrence.  If you do have a gout flare in the future, can take 2 of the colchicine at onset of symptoms, then repeat was 1 pill in an hour if needed.  Once symptoms have resolved for 3 to 4 weeks, return for a lab only visit for gout test to determine if daily medication needed.  If pain in the right foot is not improving with prednisone, then would recommend follow-up with foot specialist due to previous surgery in that area.  Return to the clinic or go to the nearest emergency room if any of your symptoms worsen or new symptoms occur.   If you have lab work done today you will be contacted with your lab results within the next 2 weeks.  If you have not heard from [place] then please contact us. The fastest way to get your results is to register for My Chart.   IF you received an x-ray today, you will receive an invoice from [facility]. Please contact [facility] at [phone] with questions or concerns regarding your invoice.   IF you received labwork today, you will receive an invoice from [place]. Please contact [facility] at [phone] with questions or concerns regarding your invoice.   Our billing staff will not be able to assist you with questions regarding bills from these companies.  You will be contacted with the lab results as soon as they are available. The fastest way to get your results is to activate your My Chart account. Instructions are located on the last page of this paperwork. If you have not heard from [place] regarding the results in 2 weeks, please contact this office.

## 2018-08-19 NOTE — Progress Notes (Signed)
Subjective:    Patient ID: Gerald Fisher, male    DOB: 10-01-1950, 68 y.o.   MRN: [id number]  PCP: Horald Pollen, MD   Chief Complaint  Patient presents with  . Foot Pain    last time it was the right foot then it switched to both x1week      HPI Gerald Fisher is a 68 y.o. male who presents to Primary Care at [facility] complaining of foot pain.    Last seen on 08/05/2018. At that time, he was having pain consistent with gout flair. Thought to be attributed to diet changes and alcohol increase  Prednisone 40mg  x 5 days as insufficient treatment with NSAID   Original pain affected both feet. After NSAID treatment, pain resolved in the right foot.  The left foot began to bother him around 08/10/2018. He did not treat with over the counter medicine. Left foot pain last week was "terrible," but it has improved slightly. He has not fallen or twisted his ankles.  Pt states that he has has neither seafood nor alcohol in the last month.   Review of Systems Per HPI    Objective:   Physical Exam Constitutional:      General: He is not in acute distress.    Appearance: He is well-developed.  HENT:     Head: Normocephalic and atraumatic.  Cardiovascular:     Rate and Rhythm: Normal rate.  Pulmonary:     Effort: Pulmonary effort is normal.  Feet:     Right foot:     Skin integrity: No erythema.     Left foot:     Skin integrity: No erythema.     Comments: LEFT:Soft tissue swelling on first NTP.  RIGHT: First NTP also with soft tissue swelling.  No open wounds in either foot.  Neurological:     Mental Status: He is alert and oriented to person, place, and time.       Assessment & Plan:   Gerald Fisher is a 68 y.o. male Gout of multiple sites, unspecified cause, unspecified chronicity - Plan: colchicine 0.6 MG tablet, Uric Acid, predniSONE (DELTASONE) 20 MG tablet  -Suspected gout with podagra bilaterally, slight improvement on the left, still some discomfort in the  right.  Did have some initial improvement with prednisone at 5 days of treatment.  Still suspect seafood/alcohol may have triggered gout flare.  -Restart prednisone at 40 mg daily with a taper over 8 days.  -Colchicine provided if recurrence of flare and instructions as well as risks and side effects were discussed  -Plan for uric acid level when he is not in gout flare to determine if daily meds indicated  -If persistent pain in the right first metatarsal, would consider podiatry/foot specialist evaluation given prior surgery.  Meds ordered this encounter  Medications  . colchicine 0.6 MG tablet    Sig: Take 2 tablets (1.2 mg total) by mouth once for 1 dose. With additional 0.6mg  in 1 hour if needed only.    Dispense:  5 tablet    Refill:  1  . predniSONE (DELTASONE) 20 MG tablet    Sig: 2 by mouth for 3 days, then 1 by mouth for 3 days, then 1/2 by mouth for 2 days.    Dispense:  10 tablet    Refill:  0   Patient Instructions    Foot pain still appears to be due to gout.  Okay to restart prednisone at 40 mg/day for a  few days, then taper should lessen risk of recurrence.  If you do have a gout flare in the future, can take 2 of the colchicine at onset of symptoms, then repeat was 1 pill in an hour if needed.  Once symptoms have resolved for 3 to 4 weeks, return for a lab only visit for gout test to determine if daily medication needed.  If pain in the right foot is not improving with prednisone, then would recommend follow-up with foot specialist due to previous surgery in that area.  Return to the clinic or go to the nearest emergency room if any of your symptoms worsen or new symptoms occur.   If you have lab work done today you will be contacted with your lab results within the next 2 weeks.  If you have not heard from [place] then please contact us. The fastest way to get your results is to register for My Chart.   IF you received an x-ray today, you will receive an invoice from  [facility]. Please contact [facility] at [phone] with questions or concerns regarding your invoice.   IF you received labwork today, you will receive an invoice from [facility]. Please contact [facility] at [phone] with questions or concerns regarding your invoice.   Our billing staff will not be able to assist you with questions regarding bills from these companies.  You will be contacted with the lab results as soon as they are available. The fastest way to get your results is to activate your My Chart account. Instructions are located on the last page of this paperwork. If you have not heard from [place] regarding the results in 2 weeks, please contact this office.        I, [employer], acting as a Education administrator for Wendie Agreste, MD, have documented all relevant documentation on the behalf of Wendie Agreste, MD, as directed by Wendie Agreste, MD while in the presence of Wendie Agreste, MD.

## 2018-08-21 ENCOUNTER — Other Ambulatory Visit: Payer: Self-pay | Admitting: Emergency Medicine

## 2018-08-21 DIAGNOSIS — I10 Essential (primary) hypertension: Secondary | ICD-10-CM

## 2018-08-22 NOTE — Telephone Encounter (Signed)
Courtesy refill 7 pills until appointment 08/29/18

## 2018-08-23 ENCOUNTER — Telehealth: Payer: Self-pay | Admitting: *Deleted

## 2018-08-23 NOTE — Telephone Encounter (Signed)
Left message  Need to schedule a AWV visit

## 2018-08-29 ENCOUNTER — Other Ambulatory Visit: Payer: Self-pay

## 2018-08-29 ENCOUNTER — Ambulatory Visit (INDEPENDENT_AMBULATORY_CARE_PROVIDER_SITE_OTHER): Payer: Medicare HMO | Admitting: Emergency Medicine

## 2018-08-29 ENCOUNTER — Encounter: Payer: Self-pay | Admitting: Emergency Medicine

## 2018-08-29 VITALS — BP 136/78 | HR 67 | Temp 98.5°F | Resp 18 | Ht 69.92 in | Wt 201.8 lb

## 2018-08-29 DIAGNOSIS — Z8739 Personal history of other diseases of the musculoskeletal system and connective tissue: Secondary | ICD-10-CM | POA: Diagnosis not present

## 2018-08-29 DIAGNOSIS — Z8639 Personal history of other endocrine, nutritional and metabolic disease: Secondary | ICD-10-CM

## 2018-08-29 DIAGNOSIS — Z23 Encounter for immunization: Secondary | ICD-10-CM

## 2018-08-29 DIAGNOSIS — R7303 Prediabetes: Secondary | ICD-10-CM | POA: Insufficient documentation

## 2018-08-29 DIAGNOSIS — I10 Essential (primary) hypertension: Secondary | ICD-10-CM | POA: Diagnosis not present

## 2018-08-29 MED ORDER — LISINOPRIL 20 MG PO TABS: 20.0000 mg | ORAL_TABLET | Freq: Every day | ORAL | 3 refills | Status: DC

## 2018-08-29 NOTE — Patient Instructions (Addendum)
   If you have lab work done today you will be contacted with your lab results within the next 2 weeks.  If you have not heard from us then please contact us. The fastest way to get your results is to register for My Chart.   IF you received an x-ray today, you will receive an invoice from [facility]. Please contact [facility] at [phone] with questions or concerns regarding your invoice.   IF you received labwork today, you will receive an invoice from [facility]. Please contact [facility] at [phone] with questions or concerns regarding your invoice.   Our billing staff will not be able to assist you with questions regarding bills from these companies.  You will be contacted with the lab results as soon as they are available. The fastest way to get your results is to activate your My Chart account. Instructions are located on the last page of this paperwork. If you have not heard from us regarding the results in 2 weeks, please contact this office.       Hypertension Hypertension, commonly called high blood pressure, is when the force of blood pumping through the arteries is too strong. The arteries are the blood vessels that carry blood from the heart throughout the body. Hypertension forces the heart to work harder to pump blood and may cause arteries to become narrow or stiff. Having untreated or uncontrolled hypertension can cause heart attacks, strokes, kidney disease, and other problems. A blood pressure reading consists of a higher number over a lower number. Ideally, your blood pressure should be below 120/80. The first ("top") number is called the systolic pressure. It is a measure of the pressure in your arteries as your heart beats. The second ("bottom") number is called the diastolic pressure. It is a measure of the pressure in your arteries as the heart relaxes. What are the causes? The cause of this condition is not known. What increases the  risk? Some risk factors for high blood pressure are under your control. Others are not. Factors you can change  Smoking.  Having type 2 diabetes mellitus, high cholesterol, or both.  Not getting enough exercise or physical activity.  Being overweight.  Having too much fat, sugar, calories, or salt (sodium) in your diet.  Drinking too much alcohol. Factors that are difficult or impossible to change  Having chronic kidney disease.  Having a family history of high blood pressure.  Age. Risk increases with age.  Race. You may be at higher risk if you are African-American.  Gender. Men are at higher risk than women before age 45. After age 65, women are at higher risk than men.  Having obstructive sleep apnea.  Stress. What are the signs or symptoms? Extremely high blood pressure (hypertensive crisis) may cause:  Headache.  Anxiety.  Shortness of breath.  Nosebleed.  Nausea and vomiting.  Severe chest pain.  Jerky movements you cannot control (seizures). How is this diagnosed? This condition is diagnosed by measuring your blood pressure while you are seated, with your arm resting on a surface. The cuff of the blood pressure monitor will be placed directly against the skin of your upper arm at the level of your heart. It should be measured at least twice using the same arm. Certain conditions can cause a difference in blood pressure between your right and left arms. Certain factors can cause blood pressure readings to be lower or higher than normal (elevated) for a short period of time:    When your blood pressure is higher when you are in a health care provider's office than when you are at home, this is called white coat hypertension. Most people with this condition do not need medicines.  When your blood pressure is higher at home than when you are in a health care provider's office, this is called masked hypertension. Most people with this condition may need medicines  to control blood pressure. If you have a high blood pressure reading during one visit or you have normal blood pressure with other risk factors:  You may be asked to return on a different day to have your blood pressure checked again.  You may be asked to monitor your blood pressure at home for 1 week or longer. If you are diagnosed with hypertension, you may have other blood or imaging tests to help your health care provider understand your overall risk for other conditions. How is this treated? This condition is treated by making healthy lifestyle changes, such as eating healthy foods, exercising more, and reducing your alcohol intake. Your health care provider may prescribe medicine if lifestyle changes are not enough to get your blood pressure under control, and if:  Your systolic blood pressure is above 130.  Your diastolic blood pressure is above 80. Your personal target blood pressure may vary depending on your medical conditions, your age, and other factors. Follow these instructions at home: Eating and drinking   Eat a diet that is high in fiber and potassium, and low in sodium, added sugar, and fat. An example eating plan is called the DASH (Dietary Approaches to Stop Hypertension) diet. To eat this way: ? Eat plenty of fresh fruits and vegetables. Try to fill half of your plate at each meal with fruits and vegetables. ? Eat whole grains, such as whole wheat pasta, brown rice, or whole grain bread. Fill about one quarter of your plate with whole grains. ? Eat or drink low-fat dairy products, such as skim milk or low-fat yogurt. ? Avoid fatty cuts of meat, processed or cured meats, and poultry with skin. Fill about one quarter of your plate with lean proteins, such as fish, chicken without skin, beans, eggs, and tofu. ? Avoid premade and processed foods. These tend to be higher in sodium, added sugar, and fat.  Reduce your daily sodium intake. Most people with hypertension should  eat less than 1,500 mg of sodium a day.  Limit alcohol intake to no more than 1 drink a day for nonpregnant women and 2 drinks a day for men. One drink equals 12 oz of beer, 5 oz of wine, or 1 oz of hard liquor. Lifestyle   Work with your health care provider to maintain a healthy body weight or to lose weight. Ask what an ideal weight is for you.  Get at least 30 minutes of exercise that causes your heart to beat faster (aerobic exercise) most days of the week. Activities may include walking, swimming, or biking.  Include exercise to strengthen your muscles (resistance exercise), such as pilates or lifting weights, as part of your weekly exercise routine. Try to do these types of exercises for 30 minutes at least 3 days a week.  Do not use any products that contain nicotine or tobacco, such as cigarettes and e-cigarettes. If you need help quitting, ask your health care provider.  Monitor your blood pressure at home as told by your health care provider.  Keep all follow-up visits as told by your health care provider.   This is important. Medicines  Take over-the-counter and prescription medicines only as told by your health care provider. Follow directions carefully. Blood pressure medicines must be taken as prescribed.  Do not skip doses of blood pressure medicine. Doing this puts you at risk for problems and can make the medicine less effective.  Ask your health care provider about side effects or reactions to medicines that you should watch for. Contact a health care provider if:  You think you are having a reaction to a medicine you are taking.  You have headaches that keep coming back (recurring).  You feel dizzy.  You have swelling in your ankles.  You have trouble with your vision. Get help right away if:  You develop a severe headache or confusion.  You have unusual weakness or numbness.  You feel faint.  You have severe pain in your chest or abdomen.  You vomit  repeatedly.  You have trouble breathing. Summary  Hypertension is when the force of blood pumping through your arteries is too strong. If this condition is not controlled, it may put you at risk for serious complications.  Your personal target blood pressure may vary depending on your medical conditions, your age, and other factors. For most people, a normal blood pressure is less than 120/80.  Hypertension is treated with lifestyle changes, medicines, or a combination of both. Lifestyle changes include weight loss, eating a healthy, low-sodium diet, exercising more, and limiting alcohol. This information is not intended to replace advice given to you by your health care provider. Make sure you discuss any questions you have with your health care provider. Document Released: 07/20/2005 Document Revised: 06/17/2016 Document Reviewed: 06/17/2016 [employer] Interactive Patient Education  2019 [employer]  

## 2018-08-29 NOTE — Progress Notes (Addendum)
BP Readings from Last 3 Encounters:  08/29/18 (!) 151/83  08/19/18 137/82  08/05/18 140/82   Gerald Fisher 68 y.o.   Chief Complaint  Patient presents with  . Hypertension    f/u  . Medication Refill    Lisinopril    HISTORY OF PRESENT ILLNESS:  This is a 68 y.o. male with history of hypertension here for follow-up and medication refill. Has had a cold for 1 week.  Starting to get better. No other complaints or medical concerns at this time. Last visit with me 2 years ago.  Blood work concerns at that time where mild renal insufficiency, prediabetes, and high cholesterol.  HPI   Prior to Admission medications   Medication Sig Start Date End Date Taking? Authorizing Provider  aspirin EC 81 MG tablet Take 81 mg by mouth daily.   Yes [provider]  lisinopril (PRINIVIL,ZESTRIL) 20 MG tablet Take 1 tablet (20 mg total) by mouth daily. Must keep 08/29/18 appointment for further refills. 08/22/18  Yes Lajoya Dombek, Ines Bloomer, MD  colchicine 0.6 MG tablet Take 2 tablets (1.2 mg total) by mouth once for 1 dose. With additional 0.6mg  in 1 hour if needed only. 08/19/18 08/19/18  Wendie Agreste, MD  HYDROcodone-acetaminophen (NORCO/VICODIN) 5-325 MG tablet Take 1 tablet by mouth every 6 (six) hours as needed for moderate pain. Patient not taking: Reported on 08/29/2018 08/05/18   Wendie Agreste, MD  naproxen sodium (ALEVE) 220 MG tablet Take 220 mg by mouth.    [provider]  predniSONE (DELTASONE) 20 MG tablet 2 by mouth for 3 days, then 1 by mouth for 3 days, then 1/2 by mouth for 2 days. Patient not taking: Reported on 08/29/2018 08/19/18   Wendie Agreste, MD    No Known Allergies  Patient Active Problem List   Diagnosis Date Noted  . Colon cancer screening 03/11/2017  . 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 Essential hypertension 05/24/2015    Past Medical History:  Diagnosis Date  . Hypertension     Past Surgical History:  Procedure  Laterality Date  . FOOT SURGERY      Social History   Socioeconomic History  . Marital status: Single    Spouse name: Not on file  . Number of children: 2  . Years of education: Not on file  . Highest education level: Not on file  Occupational History  . Not on file  Social Needs  . Financial resource strain: Not on file  . Food insecurity:    Worry: Not on file    Inability: Not on file  . Transportation needs:    Medical: Not on file    Non-medical: Not on file  Tobacco Use  . Smoking status: Never Smoker  . Smokeless tobacco: Never Used  Substance and Sexual Activity  . Alcohol use: Yes  . Drug use: No  . Sexual activity: Yes    Birth control/protection: Condom  Lifestyle  . Physical activity:    Days per week: Not on file    Minutes per session: Not on file  . Stress: Not on file  Relationships  . Social connections:    Talks on phone: Not on file    Gets together: Not on file    Attends religious service: Not on file    Active member of club or organization: Not on file    Attends meetings of clubs or organizations: Not on file    Relationship status: Not  on file  . Intimate partner violence:    Fear of current or ex partner: Not on file    Emotionally abused: Not on file    Physically abused: Not on file    Forced sexual activity: Not on file  Other Topics Concern  . Not on file  Social History Narrative  . Not on file    Family History  Problem Relation Age of Onset  . Heart disease Mother   . Hypertension Father      Review of Systems  Constitutional: Negative.  Negative for chills and fever.  HENT: Positive for congestion. Negative for ear pain and sore throat.   Eyes: Negative.  Negative for blurred vision and double vision.  Respiratory: Negative for cough and shortness of breath.   Cardiovascular: Negative.  Negative for chest pain and palpitations.  Gastrointestinal: Negative.  Negative for abdominal pain, diarrhea, nausea and vomiting.    Genitourinary: Negative.  Negative for dysuria.  Musculoskeletal: Negative.  Negative for back pain, myalgias and neck pain.  Skin: Negative.  Negative for rash.  Neurological: Negative.  Negative for dizziness and headaches.  Endo/Heme/Allergies: Negative.   All other systems reviewed and are negative.   Vitals:   08/29/18 1017  BP: (!) 151/83  Pulse: 67  Resp: 18  Temp: 98.5 F (36.9 C)  SpO2: 100%    Physical Exam Vitals signs reviewed.  Constitutional:      Appearance: Normal appearance.  HENT:     Head: Normocephalic and atraumatic.     Nose: Nose normal.     Mouth/Throat:     Mouth: Mucous membranes are moist.     Pharynx: Oropharynx is clear.  Eyes:     Extraocular Movements: Extraocular movements intact.     Conjunctiva/sclera: Conjunctivae normal.     Pupils: Pupils are equal, round, and reactive to light.  Neck:     Musculoskeletal: Normal range of motion and neck supple.  Cardiovascular:     Rate and Rhythm: Normal rate and regular rhythm.     Heart sounds: Normal heart sounds.  Pulmonary:     Effort: Pulmonary effort is normal.     Breath sounds: Normal breath sounds.  Abdominal:     General: There is no distension.     Palpations: Abdomen is soft.     Tenderness: There is no abdominal tenderness.  Musculoskeletal: Normal range of motion.  Skin:    General: Skin is warm and dry.  Neurological:     General: No focal deficit present.     Mental Status: He is alert and oriented to person, place, and time.  Psychiatric:        Mood and Affect: Mood normal.        Behavior: Behavior normal.    The 10-year ASCVD risk score Gerald Fisher DC Jr., et al., 2013) is: 20.7%   Values used to calculate the score:     Age: 20 years     Sex: Male     Is Non-Hispanic African American: Yes     Diabetic: No     Tobacco smoker: No     Systolic Blood Pressure: 376 mmHg     Is BP treated: Yes     HDL Cholesterol: 67 mg/dL     Total Cholesterol: 286  mg/dL   ASSESSMENT & PLAN: Gerald Fisher was seen today for hypertension and medication refill.  Diagnoses and all orders for this visit:  Essential hypertension -     lisinopril (PRINIVIL,ZESTRIL) 20 MG  tablet; Take 1 tablet (20 mg total) by mouth daily. -     CBC with Differential/Platelet -     Comprehensive metabolic panel -     Lipid panel -     Hemoglobin A1c -     Uric Acid -     Pneumococcal polysaccharide vaccine 23-valent greater than or equal to 2yo subcutaneous/IM  Need for Tdap vaccination -     Tdap vaccine greater than or equal to 7yo IM  Prediabetes -     Hemoglobin A1c  History of high cholesterol -     Lipid panel  History of gout -     Uric Acid    Patient Instructions       If you have lab work done today you will be contacted with your lab results within the next 2 weeks.  If you have not heard from [place] then please contact us. The fastest way to get your results is to register for My Chart.   IF you received an x-ray today, you will receive an invoice from [facility]. Please contact [facility] at [phone] with questions or concerns regarding your invoice.   IF you received labwork today, you will receive an invoice from [facility]. Please contact [facility] at [phone] with questions or concerns regarding your invoice.   Our billing staff will not be able to assist you with questions regarding bills from these companies.  You will be contacted with the lab results as soon as they are available. The fastest way to get your results is to activate your My Chart account. Instructions are located on the last page of this paperwork. If you have not heard from [place] regarding the results in 2 weeks, please contact this office.     Hypertension Hypertension, commonly called high blood pressure, is when the force of blood pumping through the arteries is too strong. The arteries are the blood vessels that carry blood from the heart throughout  the body. Hypertension forces the heart to work harder to pump blood and may cause arteries to become narrow or stiff. Having untreated or uncontrolled hypertension can cause heart attacks, strokes, kidney disease, and other problems. A blood pressure reading consists of a higher number over a lower number. Ideally, your blood pressure should be below 120/80. The first ("top") number is called the systolic pressure. It is a measure of the pressure in your arteries as your heart beats. The second ("bottom") number is called the diastolic pressure. It is a measure of the pressure in your arteries as the heart relaxes. What are the causes? The cause of this condition is not known. What increases the risk? Some risk factors for high blood pressure are under your control. Others are not. Factors you can change  Smoking.  Having type 2 diabetes mellitus, high cholesterol, or both.  Not getting enough exercise or physical activity.  Being overweight.  Having too much fat, sugar, calories, or salt (sodium) in your diet.  Drinking too much alcohol. Factors that are difficult or impossible to change  Having chronic kidney disease.  Having a family history of high blood pressure.  Age. Risk increases with age.  Race. You may be at higher risk if you are African-American.  Gender. Men are at higher risk than women before age 52. After age 22, women are at higher risk than men.  Having obstructive sleep apnea.  Stress. What are the signs or symptoms? Extremely high blood pressure (hypertensive crisis) may cause:  Headache.  Anxiety.  Shortness of breath.  Nosebleed.  Nausea and vomiting.  Severe chest pain.  Jerky movements you cannot control (seizures). How is this diagnosed? This condition is diagnosed by measuring your blood pressure while you are seated, with your arm resting on a surface. The cuff of the blood pressure monitor will be placed directly against the skin of  your upper arm at the level of your heart. It should be measured at least twice using the same arm. Certain conditions can cause a difference in blood pressure between your right and left arms. Certain factors can cause blood pressure readings to be lower or higher than normal (elevated) for a short period of time:  When your blood pressure is higher when you are in a health care provider's office than when you are at home, this is called white coat hypertension. Most people with this condition do not need medicines.  When your blood pressure is higher at home than when you are in a health care provider's office, this is called masked hypertension. Most people with this condition may need medicines to control blood pressure. If you have a high blood pressure reading during one visit or you have normal blood pressure with other risk factors:  You may be asked to return on a different day to have your blood pressure checked again.  You may be asked to monitor your blood pressure at home for 1 week or longer. If you are diagnosed with hypertension, you may have other blood or imaging tests to help your health care provider understand your overall risk for other conditions. How is this treated? This condition is treated by making healthy lifestyle changes, such as eating healthy foods, exercising more, and reducing your alcohol intake. Your health care provider may prescribe medicine if lifestyle changes are not enough to get your blood pressure under control, and if:  Your systolic blood pressure is above 130.  Your diastolic blood pressure is above 80. Your personal target blood pressure may vary depending on your medical conditions, your age, and other factors. Follow these instructions at home: Eating and drinking   Eat a diet that is high in fiber and potassium, and low in sodium, added sugar, and fat. An example eating plan is called the DASH (Dietary Approaches to Stop Hypertension) diet.  To eat this way: ? Eat plenty of fresh fruits and vegetables. Try to fill half of your plate at each meal with fruits and vegetables. ? Eat whole grains, such as whole wheat pasta, brown rice, or whole grain bread. Fill about one quarter of your plate with whole grains. ? Eat or drink low-fat dairy products, such as skim milk or low-fat yogurt. ? Avoid fatty cuts of meat, processed or cured meats, and poultry with skin. Fill about one quarter of your plate with lean proteins, such as fish, chicken without skin, beans, eggs, and tofu. ? Avoid premade and processed foods. These tend to be higher in sodium, added sugar, and fat.  Reduce your daily sodium intake. Most people with hypertension should eat less than 1,500 mg of sodium a day.  Limit alcohol intake to no more than 1 drink a day for nonpregnant women and 2 drinks a day for men. One drink equals 12 oz of beer, 5 oz of wine, or 1 oz of hard liquor. Lifestyle   Work with your health care provider to maintain a healthy body weight or to lose weight. Ask what an ideal weight is for  you.  Get at least 30 minutes of exercise that causes your heart to beat faster (aerobic exercise) most days of the week. Activities may include walking, swimming, or biking.  Include exercise to strengthen your muscles (resistance exercise), such as pilates or lifting weights, as part of your weekly exercise routine. Try to do these types of exercises for 30 minutes at least 3 days a week.  Do not use any products that contain nicotine or tobacco, such as cigarettes and e-cigarettes. If you need help quitting, ask your health care provider.  Monitor your blood pressure at home as told by your health care provider.  Keep all follow-up visits as told by your health care provider. This is important. Medicines  Take over-the-counter and prescription medicines only as told by your health care provider. Follow directions carefully. Blood pressure medicines must be  taken as prescribed.  Do not skip doses of blood pressure medicine. Doing this puts you at risk for problems and can make the medicine less effective.  Ask your health care provider about side effects or reactions to medicines that you should watch for. Contact a health care provider if:  You think you are having a reaction to a medicine you are taking.  You have headaches that keep coming back (recurring).  You feel dizzy.  You have swelling in your ankles.  You have trouble with your vision. Get help right away if:  You develop a severe headache or confusion.  You have unusual weakness or numbness.  You feel faint.  You have severe pain in your chest or abdomen.  You vomit repeatedly.  You have trouble breathing. Summary  Hypertension is when the force of blood pumping through your arteries is too strong. If this condition is not controlled, it may put you at risk for serious complications.  Your personal target blood pressure may vary depending on your medical conditions, your age, and other factors. For most people, a normal blood pressure is less than 120/80.  Hypertension is treated with lifestyle changes, medicines, or a combination of both. Lifestyle changes include weight loss, eating a healthy, low-sodium diet, exercising more, and limiting alcohol. This information is not intended to replace advice given to you by your health care provider. Make sure you discuss any questions you have with your health care provider. Document Released: 07/20/2005 Document Revised: 06/17/2016 Document Reviewed: 06/17/2016 Elsevier Interactive Patient Education  2019 [employer]      [name], MD [facility]

## 2018-08-30 ENCOUNTER — Encounter: Payer: Self-pay | Admitting: Radiology

## 2018-08-30 ENCOUNTER — Other Ambulatory Visit: Payer: Self-pay | Admitting: Emergency Medicine

## 2018-08-30 LAB — COMPREHENSIVE METABOLIC PANEL
ALT: 22 IU/L (ref 0–44)
AST: 7 IU/L (ref 0–40)
Albumin/Globulin Ratio: 1.7 (ref 1.2–2.2)
Albumin: 4.1 g/dL (ref 3.8–4.8)
Alkaline Phosphatase: 84 IU/L (ref 39–117)
BUN/Creatinine Ratio: 13 (ref 10–24)
BUN: 19 mg/dL (ref 8–27)
Bilirubin Total: 0.4 mg/dL (ref 0.0–1.2)
CALCIUM: 9.8 mg/dL (ref 8.6–10.2)
CO2: 24 mmol/L (ref 20–29)
CREATININE: 1.49 mg/dL — AB (ref 0.76–1.27)
Chloride: 99 mmol/L (ref 96–106)
GFR calc non Af Amer: 47 mL/min/{1.73_m2} — ABNORMAL LOW (ref >59)
GFR, EST AFRICAN AMERICAN: 55 mL/min/{1.73_m2} — AB (ref >59)
Globulin, Total: 2.4 g/dL (ref 1.5–4.5)
Glucose: 102 mg/dL — ABNORMAL HIGH (ref 65–99)
Potassium: 4.3 mmol/L (ref 3.5–5.2)
Sodium: 142 mmol/L (ref 134–144)
TOTAL PROTEIN: 6.5 g/dL (ref 6.0–8.5)

## 2018-08-30 LAB — CBC WITH DIFFERENTIAL/PLATELET
BASOS: 0 %
Basophils Absolute: 0 10*3/uL (ref 0.0–0.2)
EOS (ABSOLUTE): 0.1 10*3/uL (ref 0.0–0.4)
Eos: 2 %
Hematocrit: 48.1 % (ref 37.5–51.0)
Hemoglobin: 16.8 g/dL (ref 13.0–17.7)
IMMATURE GRANULOCYTES: 1 %
Immature Grans (Abs): 0 10*3/uL (ref 0.0–0.1)
Lymphocytes Absolute: 2.1 10*3/uL (ref 0.7–3.1)
Lymphs: 43 %
MCH: 31.6 pg (ref 26.6–33.0)
MCHC: 34.9 g/dL (ref 31.5–35.7)
MCV: 90 fL (ref 79–97)
MONOCYTES: 8 %
Monocytes Absolute: 0.4 10*3/uL (ref 0.1–0.9)
Neutrophils Absolute: 2.2 10*3/uL (ref 1.4–7.0)
Neutrophils: 46 %
Platelets: 279 10*3/uL (ref 150–450)
RBC: 5.32 x10E6/uL (ref 4.14–5.80)
RDW: 12.7 % (ref 11.6–15.4)
WBC: 4.8 10*3/uL (ref 3.4–10.8)

## 2018-08-30 LAB — HEMOGLOBIN A1C
Est. average glucose Bld gHb Est-mCnc: 123 mg/dL
Hgb A1c MFr Bld: 5.9 % — ABNORMAL HIGH (ref 4.8–5.6)

## 2018-08-30 LAB — LIPID PANEL
CHOL/HDL RATIO: 4.3 ratio (ref 0.0–5.0)
Cholesterol, Total: 286 mg/dL — ABNORMAL HIGH (ref 100–199)
HDL: 67 mg/dL (ref >39)
LDL Calculated: 177 mg/dL — ABNORMAL HIGH (ref 0–99)
Triglycerides: 212 mg/dL — ABNORMAL HIGH (ref 0–149)
VLDL Cholesterol Cal: 42 mg/dL — ABNORMAL HIGH (ref 5–40)

## 2018-08-30 LAB — URIC ACID: Uric Acid: 7.3 mg/dL (ref 3.7–8.6)

## 2018-08-30 MED ORDER — ROSUVASTATIN CALCIUM 20 MG PO TABS: 20.0000 mg | ORAL_TABLET | Freq: Every day | ORAL | 3 refills | Status: DC

## 2018-08-31 ENCOUNTER — Ambulatory Visit (INDEPENDENT_AMBULATORY_CARE_PROVIDER_SITE_OTHER): Payer: Medicare HMO | Admitting: Emergency Medicine

## 2018-08-31 ENCOUNTER — Other Ambulatory Visit: Payer: Self-pay

## 2018-08-31 ENCOUNTER — Encounter: Payer: Self-pay | Admitting: Emergency Medicine

## 2018-08-31 VITALS — BP 149/88 | HR 96 | Temp 98.6°F | Resp 16 | Ht 70.0 in | Wt 201.6 lb

## 2018-08-31 DIAGNOSIS — M109 Gout, unspecified: Secondary | ICD-10-CM | POA: Diagnosis not present

## 2018-08-31 DIAGNOSIS — M79675 Pain in left toe(s): Secondary | ICD-10-CM | POA: Diagnosis not present

## 2018-08-31 MED ORDER — PREDNISONE 20 MG PO TABS: 40.0000 mg | ORAL_TABLET | Freq: Every day | ORAL | 0 refills | Status: AC

## 2018-08-31 MED ORDER — HYDROCODONE-ACETAMINOPHEN 5-325 MG PO TABS: 1.0000 | ORAL_TABLET | Freq: Four times a day (QID) | ORAL | 0 refills | Status: DC | PRN

## 2018-08-31 NOTE — Progress Notes (Signed)
Gerald Fisher 68 y.o.   Chief Complaint  Patient presents with  . Gout    Left GREAT toe started yesterday-swellin and redness    HISTORY OF PRESENT ILLNESS: This is a 68 y.o. male with history of gout complaining of pain to left great toe since yesterday.  Complaining of redness, swelling, and pain,.  Denies injury.  HPI   Prior to Admission medications   Medication Sig Start Date End Date Taking? Authorizing Provider  aspirin EC 81 MG tablet Take 81 mg by mouth daily.   Yes [provider]  lisinopril (PRINIVIL,ZESTRIL) 20 MG tablet Take 1 tablet (20 mg total) by mouth daily. 08/29/18 11/27/18 Yes Rhianon Zabawa, Ines Bloomer, MD  HYDROcodone-acetaminophen (NORCO/VICODIN) 5-325 MG tablet Take 1 tablet by mouth every 6 (six) hours as needed for moderate pain. Patient not taking: Reported on 08/31/2018 08/05/18   Wendie Agreste, MD  naproxen sodium (ALEVE) 220 MG tablet Take 220 mg by mouth.    [provider]  predniSONE (DELTASONE) 20 MG tablet 2 by mouth for 3 days, then 1 by mouth for 3 days, then 1/2 by mouth for 2 days. Patient not taking: Reported on 08/31/2018 08/19/18   Wendie Agreste, MD  rosuvastatin (CRESTOR) 20 MG tablet Take 1 tablet (20 mg total) by mouth daily. Patient not taking: Reported on 08/31/2018 08/30/18   Horald Pollen, MD    No Known Allergies  Patient Active Problem List   Diagnosis Date Noted  . Prediabetes 08/29/2018  . History of gout 08/29/2018  . Colon cancer screening 03/11/2017  . 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 Essential hypertension 05/24/2015    Past Medical History:  Diagnosis Date  . Hypertension     Past Surgical History:  Procedure Laterality Date  . FOOT SURGERY      Social History   Socioeconomic History  . Marital status: Single    Spouse name: Not on file  . Number of children: 2  . Years of education: Not on file  . Highest education level: Not on file  Occupational History  .  Not on file  Social Needs  . Financial resource strain: Not on file  . Food insecurity:    Worry: Not on file    Inability: Not on file  . Transportation needs:    Medical: Not on file    Non-medical: Not on file  Tobacco Use  . Smoking status: Never Smoker  . Smokeless tobacco: Never Used  Substance and Sexual Activity  . Alcohol use: Yes  . Drug use: No  . Sexual activity: Yes    Birth control/protection: Condom  Lifestyle  . Physical activity:    Days per week: Not on file    Minutes per session: Not on file  . Stress: Not on file  Relationships  . Social connections:    Talks on phone: Not on file    Gets together: Not on file    Attends religious service: Not on file    Active member of club or organization: Not on file    Attends meetings of clubs or organizations: Not on file    Relationship status: Not on file  . Intimate partner violence:    Fear of current or ex partner: Not on file    Emotionally abused: Not on file    Physically abused: Not on file    Forced sexual activity: Not on file  Other Topics Concern  . Not on file  Social History Narrative  . Not on file    Family History  Problem Relation Age of Onset  . Heart disease Mother   . Hypertension Father      Review of Systems  Constitutional: Negative for chills and fever.  Respiratory: Negative.  Negative for cough and shortness of breath.   Cardiovascular: Negative.  Negative for chest pain and palpitations.  Gastrointestinal: Negative.  Negative for nausea and vomiting.  Endo/Heme/Allergies: Negative.   All other systems reviewed and are negative.   Vitals:   08/31/18 1143  BP: (!) 149/88  Pulse: 96  Resp: 16  Temp: 98.6 F (37 C)  SpO2: 98%    Physical Exam Vitals signs reviewed.  Constitutional:      Appearance: Normal appearance.  HENT:     Head: Normocephalic and atraumatic.  Eyes:     Extraocular Movements: Extraocular movements intact.     Pupils: Pupils are equal,  round, and reactive to light.  Neck:     Musculoskeletal: Normal range of motion.  Cardiovascular:     Rate and Rhythm: Regular rhythm.  Pulmonary:     Effort: Pulmonary effort is normal.  Musculoskeletal:     Comments: Left big toe: Positive erythema and swelling with tenderness and painful range of motion.  See picture below.  Skin:    General: Skin is warm.  Neurological:     General: No focal deficit present.     Mental Status: He is alert.  Psychiatric:        Mood and Affect: Mood normal.        Behavior: Behavior normal.        ASSESSMENT & PLAN: Gerald was seen today for gout.  Diagnoses and all orders for this visit:  Pain of toe of left foot -     HYDROcodone-acetaminophen (NORCO) 5-325 MG tablet; Take 1 tablet by mouth every 6 (six) hours as needed for moderate pain.  Acute gouty arthritis -     predniSONE (DELTASONE) 20 MG tablet; Take 2 tablets (40 mg total) by mouth daily with breakfast for 5 days.    Patient Instructions       If you have lab work done today you will be contacted with your lab results within the next 2 weeks.  If you have not heard from [place] then please contact us. The fastest way to get your results is to register for My Chart.   IF you received an x-ray today, you will receive an invoice from [facility]. Please contact [facility] at [phone] with questions or concerns regarding your invoice.   IF you received labwork today, you will receive an invoice from [facility]. Please contact [facility] at [phone] with questions or concerns regarding your invoice.   Our billing staff will not be able to assist you with questions regarding bills from these companies.  You will be contacted with the lab results as soon as they are available. The fastest way to get your results is to activate your My Chart account. Instructions are located on the last page of this paperwork. If you have not heard from [place] regarding the  results in 2 weeks, please contact this office.     Gout  Gout is painful swelling of your joints. Gout is a type of arthritis. It is caused by having too much uric acid in your body. Uric acid is a chemical that is made when your body breaks down substances called purines. If your body has too much  uric acid, sharp crystals can form and build up in your joints. This causes pain and swelling. Gout attacks can happen quickly and be very painful (acute gout). Over time, the attacks can affect more joints and happen more often (chronic gout). What are the causes?  Too much uric acid in your blood. This can happen because: ? Your kidneys do not remove enough uric acid from your blood. ? Your body makes too much uric acid. ? You eat too many foods that are high in purines. These foods include organ meats, some seafood, and beer.  Trauma or stress. What increases the risk?  Having a family history of gout.  Being male and middle-aged.  Being male and having gone through menopause.  Being very overweight (obese).  Drinking alcohol, especially beer.  Not having enough water in the body (being dehydrated).  Losing weight too quickly.  Having an organ transplant.  Having lead poisoning.  Taking certain medicines.  Having kidney disease.  Having a skin condition called psoriasis. What are the signs or symptoms? An attack of acute gout usually happens in just one joint. The most common place is the big toe. Attacks often start at night. Other joints that may be affected include joints of the feet, ankle, knee, fingers, wrist, or elbow. Symptoms of an attack may include:  Very bad pain.  Warmth.  Swelling.  Stiffness.  Shiny, red, or purple skin.  Tenderness. The affected joint may be very painful to touch.  Chills and fever. Chronic gout may cause symptoms more often. More joints may be involved. You may also have white or yellow lumps (tophi) on your hands or feet or  in other areas near your joints. How is this treated?  Treatment for this condition has two phases: treating an acute attack and preventing future attacks.  Acute gout treatment may include: ? NSAIDs. ? Steroids. These are taken by mouth or injected into a joint. ? Colchicine. This medicine relieves pain and swelling. It can be given by mouth or through an IV tube.  Preventive treatment may include: ? Taking small doses of NSAIDs or colchicine daily. ? Using a medicine that reduces uric acid levels in your blood. ? Making changes to your diet. You may need to see a food expert (dietitian) about what to eat and drink to prevent gout. Follow these instructions at home: During a gout attack   If told, put ice on the painful area: ? Put ice in a plastic bag. ? Place a towel between your skin and the bag. ? Leave the ice on for 20 minutes, 2-3 times a day.  Raise (elevate) the painful joint above the level of your heart as often as you can.  Rest the joint as much as possible. If the joint is in your leg, you may be given crutches.  Follow instructions from your doctor about what you cannot eat or drink. Avoiding future gout attacks  Eat a low-purine diet. Avoid foods and drinks such as: ? Liver. ? Kidney. ? Anchovies. ? Asparagus. ? Herring. ? Mushrooms. ? Mussels. ? Beer.  Stay at a healthy weight. If you want to lose weight, talk with your doctor. Do not lose weight too fast.  Start or continue an exercise plan as told by your doctor. Eating and drinking  Drink enough fluids to keep your pee (urine) pale yellow.  If you drink alcohol: ? Limit how much you use to:  0-1 drink a day for women.  0-2  drinks a day for men. ? Be aware of how much alcohol is in your drink. In the [place] one drink equals one 12 oz bottle of beer (355 mL), one 5 oz glass of wine (148 mL), or one 1 oz glass of hard liquor (44 mL). General instructions  Take over-the-counter and prescription  medicines only as told by your doctor.  Do not drive or use heavy machinery while taking prescription pain medicine.  Return to your normal activities as told by your doctor. Ask your doctor what activities are safe for you.  Keep all follow-up visits as told by your doctor. This is important. Contact a doctor if:  You have another gout attack.  You still have symptoms of a gout attack after 10 days of treatment.  You have problems (side effects) because of your medicines.  You have chills or a fever.  You have burning pain when you pee (urinate).  You have pain in your lower back or belly. Get help right away if:  You have very bad pain.  Your pain cannot be controlled.  You cannot pee. Summary  Gout is painful swelling of the joints.  The most common site of pain is the big toe, but it can affect other joints.  Medicines and avoiding some foods can help to prevent and treat gout attacks. This information is not intended to replace advice given to you by your health care provider. Make sure you discuss any questions you have with your health care provider. Document Released: 04/28/2008 Document Revised: 02/09/2018 Document Reviewed: 02/09/2018 Elsevier Interactive Patient Education  2019 Elsevier Inc.      [name], MD [facility]

## 2018-08-31 NOTE — Patient Instructions (Addendum)
If you have lab work done today you will be contacted with your lab results within the next 2 weeks.  If you have not heard from [place] then please contact us. The fastest way to get your results is to register for My Chart.   IF you received an x-ray today, you will receive an invoice from [facility]. Please contact [facility] at [phone] with questions or concerns regarding your invoice.   IF you received labwork today, you will receive an invoice from [facility]. Please contact [facility] at [phone] with questions or concerns regarding your invoice.   Our billing staff will not be able to assist you with questions regarding bills from these companies.  You will be contacted with the lab results as soon as they are available. The fastest way to get your results is to activate your My Chart account. Instructions are located on the last page of this paperwork. If you have not heard from [place] regarding the results in 2 weeks, please contact this office.     Gout  Gout is painful swelling of your joints. Gout is a type of arthritis. It is caused by having too much uric acid in your body. Uric acid is a chemical that is made when your body breaks down substances called purines. If your body has too much uric acid, sharp crystals can form and build up in your joints. This causes pain and swelling. Gout attacks can happen quickly and be very painful (acute gout). Over time, the attacks can affect more joints and happen more often (chronic gout). What are the causes?  Too much uric acid in your blood. This can happen because: ? Your kidneys do not remove enough uric acid from your blood. ? Your body makes too much uric acid. ? You eat too many foods that are high in purines. These foods include organ meats, some seafood, and beer.  Trauma or stress. What increases the risk?  Having a family history of gout.  Being male and middle-aged.  Being male and having gone  through menopause.  Being very overweight (obese).  Drinking alcohol, especially beer.  Not having enough water in the body (being dehydrated).  Losing weight too quickly.  Having an organ transplant.  Having lead poisoning.  Taking certain medicines.  Having kidney disease.  Having a skin condition called psoriasis. What are the signs or symptoms? An attack of acute gout usually happens in just one joint. The most common place is the big toe. Attacks often start at night. Other joints that may be affected include joints of the feet, ankle, knee, fingers, wrist, or elbow. Symptoms of an attack may include:  Very bad pain.  Warmth.  Swelling.  Stiffness.  Shiny, red, or purple skin.  Tenderness. The affected joint may be very painful to touch.  Chills and fever. Chronic gout may cause symptoms more often. More joints may be involved. You may also have white or yellow lumps (tophi) on your hands or feet or in other areas near your joints. How is this treated?  Treatment for this condition has two phases: treating an acute attack and preventing future attacks.  Acute gout treatment may include: ? NSAIDs. ? Steroids. These are taken by mouth or injected into a joint. ? Colchicine. This medicine relieves pain and swelling. It can be given by mouth or through an IV tube.  Preventive treatment may include: ? Taking small doses of NSAIDs or colchicine daily. ? Using a  medicine that reduces uric acid levels in your blood. ? Making changes to your diet. You may need to see a food expert (dietitian) about what to eat and drink to prevent gout. Follow these instructions at home: During a gout attack   If told, put ice on the painful area: ? Put ice in a plastic bag. ? Place a towel between your skin and the bag. ? Leave the ice on for 20 minutes, 2-3 times a day.  Raise (elevate) the painful joint above the level of your heart as often as you can.  Rest the joint as  much as possible. If the joint is in your leg, you may be given crutches.  Follow instructions from your doctor about what you cannot eat or drink. Avoiding future gout attacks  Eat a low-purine diet. Avoid foods and drinks such as: ? Liver. ? Kidney. ? Anchovies. ? Asparagus. ? Herring. ? Mushrooms. ? Mussels. ? Beer.  Stay at a healthy weight. If you want to lose weight, talk with your doctor. Do not lose weight too fast.  Start or continue an exercise plan as told by your doctor. Eating and drinking  Drink enough fluids to keep your pee (urine) pale yellow.  If you drink alcohol: ? Limit how much you use to:  0-1 drink a day for women.  0-2 drinks a day for men. ? Be aware of how much alcohol is in your drink. In the [place] one drink equals one 12 oz bottle of beer (355 mL), one 5 oz glass of wine (148 mL), or one 1 oz glass of hard liquor (44 mL). General instructions  Take over-the-counter and prescription medicines only as told by your doctor.  Do not drive or use heavy machinery while taking prescription pain medicine.  Return to your normal activities as told by your doctor. Ask your doctor what activities are safe for you.  Keep all follow-up visits as told by your doctor. This is important. Contact a doctor if:  You have another gout attack.  You still have symptoms of a gout attack after 10 days of treatment.  You have problems (side effects) because of your medicines.  You have chills or a fever.  You have burning pain when you pee (urinate).  You have pain in your lower back or belly. Get help right away if:  You have very bad pain.  Your pain cannot be controlled.  You cannot pee. Summary  Gout is painful swelling of the joints.  The most common site of pain is the big toe, but it can affect other joints.  Medicines and avoiding some foods can help to prevent and treat gout attacks. This information is not intended to replace advice given  to you by your health care provider. Make sure you discuss any questions you have with your health care provider. Document Released: 04/28/2008 Document Revised: 02/09/2018 Document Reviewed: 02/09/2018 Elsevier Interactive Patient Education  2019 [employer].

## 2018-10-05 ENCOUNTER — Telehealth: Payer: Self-pay | Admitting: Emergency Medicine

## 2018-10-05 NOTE — Telephone Encounter (Signed)
Copied from [place] [id number]. Topic: Referral - Request for Referral >> Oct 05, 2018 11:33 AM Bea Graff, NT wrote: Has patient seen PCP for this complaint? Yes.   *If NO, is insurance requiring patient see PCP for this issue before PCP can refer them? Referral for which specialty: podiatry Preferred provider/office: any Reason for referral: left foot issue

## 2018-10-06 ENCOUNTER — Other Ambulatory Visit: Payer: Self-pay | Admitting: Emergency Medicine

## 2018-10-06 DIAGNOSIS — M79675 Pain in left toe(s): Secondary | ICD-10-CM

## 2018-10-06 NOTE — Telephone Encounter (Signed)
Podiatry referral placed. Thanks.  ?

## 2018-10-11 ENCOUNTER — Ambulatory Visit (INDEPENDENT_AMBULATORY_CARE_PROVIDER_SITE_OTHER): Payer: Medicare HMO

## 2018-10-11 ENCOUNTER — Ambulatory Visit: Payer: Medicare HMO | Admitting: Podiatry

## 2018-10-11 ENCOUNTER — Encounter: Payer: Self-pay | Admitting: Podiatry

## 2018-10-11 VITALS — BP 138/91 | HR 95 | Resp 16

## 2018-10-11 DIAGNOSIS — M779 Enthesopathy, unspecified: Secondary | ICD-10-CM | POA: Diagnosis not present

## 2018-10-11 DIAGNOSIS — M7751 Other enthesopathy of right foot: Secondary | ICD-10-CM | POA: Diagnosis not present

## 2018-10-11 DIAGNOSIS — M1A0710 Idiopathic chronic gout, right ankle and foot, without tophus (tophi): Secondary | ICD-10-CM

## 2018-10-11 DIAGNOSIS — M778 Other enthesopathies, not elsewhere classified: Secondary | ICD-10-CM

## 2018-10-11 MED ORDER — COLCHICINE 0.6 MG PO TABS: 0.6000 mg | ORAL_TABLET | Freq: Two times a day (BID) | ORAL | 3 refills | Status: DC

## 2018-10-11 NOTE — Progress Notes (Signed)
Subjective:  Patient ID: Gerald Fisher, male    DOB: Mar 14, 1951,  MRN: [id number] HPI Chief Complaint  Patient presents with  . Foot Pain    1st MPJ bilateral (R>L) - severe pain, swelling, redness, intermittent intensity x 3 months, had bloodwork and has taken 3 rounds of prednisone, using crutches and wearing surgical shoe on right   . New Patient (Initial Visit)     68 y.o. male presents with the above complaint.   Denies fever chills nausea vomiting muscle aches pains calf pain back pain chest pain shortness of breath.  Presents ambulating with a crutch today.  States that his right foot is feeling the worst.  Past Medical History:  Diagnosis Date  . Hypertension    Past Surgical History:  Procedure Laterality Date  . FOOT SURGERY      Current Outpatient Medications:  .  aspirin EC 81 MG tablet, Take 81 mg by mouth daily., Disp: , Rfl:  .  colchicine 0.6 MG tablet, Take 1 tablet (0.6 mg total) by mouth 2 (two) times daily., Disp: 60 tablet, Rfl: 3 .  HYDROcodone-acetaminophen (NORCO) 5-325 MG tablet, Take 1 tablet by mouth every 6 (six) hours as needed for moderate pain., Disp: 10 tablet, Rfl: 0 .  lisinopril (PRINIVIL,ZESTRIL) 20 MG tablet, Take 1 tablet (20 mg total) by mouth daily., Disp: 90 tablet, Rfl: 3 .  naproxen sodium (ALEVE) 220 MG tablet, Take 220 mg by mouth., Disp: , Rfl:  .  rosuvastatin (CRESTOR) 20 MG tablet, Take 1 tablet (20 mg total) by mouth daily. (Patient not taking: Reported on 08/31/2018), Disp: 90 tablet, Rfl: 3  No Known Allergies Review of Systems Objective:  There were no vitals filed for this visit.  General: Well developed, nourished, in no acute distress, alert and oriented x3   Dermatological: Skin is warm, dry and supple bilateral. Nails x 10 are well maintained; remaining integument appears unremarkable at this time. There are no open sores, no preulcerative lesions, no rash or signs of infection present.  Vascular: Dorsalis Pedis  artery and Posterior Tibial artery pedal pulses are 2/4 bilateral with immedate capillary fill time. Pedal hair growth present. No varicosities and no lower extremity edema present bilateral.   Neruologic: Grossly intact via light touch bilateral. Vibratory intact via tuning fork bilateral. Protective threshold with Semmes Wienstein monofilament intact to all pedal sites bilateral. Patellar and Achilles deep tendon reflexes 2+ bilateral. No Babinski or clonus noted bilateral.   Musculoskeletal: No gross boney pedal deformities bilateral. No pain, crepitus, or limitation noted with foot and ankle range of motion bilateral. Muscular strength 5/5 in all groups tested bilateral.  Moderate to severe erythema overlying the first metatarsal phalangeal joint of the right foot in the dorsal lateral aspect sinus tarsi area right foot.  Moderately tender on palpation of these areas.  Left foot mildly tender on range of motion of the first metatarsal phalangeal joint more than likely residual gout.  Gait: Unassisted, Nonantalgic.    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    Plan: After sterile Betadine skin prep injected 20 mg Kenalog 5 mg Marcaine point maximal tenderness of the right first metatarsal phalangeal joint and sinus tarsi.  Tolerated procedure well without complications.  Started him on colchicine 1 tablet twice a day  and I also sent him for blood work which is come back with normal readings of uric acid but very high sed rates and very high C-reactive proteins however his uric acid level is just on the high end of normal to still present slight gout and I will follow-up with him in about a week or so.     Camry Theiss T. [place], [place]

## 2018-10-13 ENCOUNTER — Ambulatory Visit: Payer: Medicare HMO | Admitting: Family Medicine

## 2018-10-13 LAB — CBC WITH DIFFERENTIAL/PLATELET
Absolute Monocytes: 607 cells/uL (ref 200–950)
Basophils Absolute: 41 cells/uL (ref 0–200)
Basophils Relative: 0.8 %
Eosinophils Absolute: 82 cells/uL (ref 15–500)
Eosinophils Relative: 1.6 %
HEMATOCRIT: 41.4 % (ref 38.5–50.0)
Hemoglobin: 14.6 g/dL (ref 13.2–17.1)
Lymphs Abs: 1816 cells/uL (ref 850–3900)
MCH: 31.4 pg (ref 27.0–33.0)
MCHC: 35.3 g/dL (ref 32.0–36.0)
MCV: 89 fL (ref 80.0–100.0)
MPV: 10.6 fL (ref 7.5–12.5)
Monocytes Relative: 11.9 %
Neutro Abs: 2555 cells/uL (ref 1500–7800)
Neutrophils Relative %: 50.1 %
Platelets: 263 10*3/uL (ref 140–400)
RBC: 4.65 10*6/uL (ref 4.20–5.80)
RDW: 12.5 % (ref 11.0–15.0)
Total Lymphocyte: 35.6 %
WBC: 5.1 10*3/uL (ref 3.8–10.8)

## 2018-10-13 LAB — RHEUMATOID FACTOR: Rhuematoid fact SerPl-aCnc: <14 IU/mL (ref <14)

## 2018-10-13 LAB — SEDIMENTATION RATE: Sed Rate: 80 mm/h — ABNORMAL HIGH (ref 0–20)

## 2018-10-13 LAB — URIC ACID: URIC ACID, SERUM: 7.4 mg/dL (ref 4.0–8.0)

## 2018-10-13 LAB — ANA: Anti Nuclear Antibody(ANA): NEGATIVE

## 2018-10-13 LAB — C-REACTIVE PROTEIN: CRP: 54.6 mg/L — ABNORMAL HIGH (ref <8.0)

## 2018-10-14 ENCOUNTER — Telehealth: Payer: Self-pay | Admitting: *Deleted

## 2018-10-14 NOTE — Telephone Encounter (Signed)
Left message informing pt of Dr. Hyatt's review of results and orders. 

## 2018-10-14 NOTE — Telephone Encounter (Signed)
-----   Message from Garrel Ridgel, [place] sent at 10/13/2018  6:53 AM EDT ----- Most likely gout.  I will see how he feels next week at his scheduled appointment.,

## 2018-10-24 ENCOUNTER — Telehealth: Payer: Self-pay | Admitting: Podiatry

## 2018-10-24 NOTE — Telephone Encounter (Signed)
I called pt and he asked if he could begin activity like mowing the lawn, and if he needed to continue the colchicine. Pt states he is feeling better after about 2 weeks on the colchicine. I told pt to begin activity slowly and wear a stiff bottom shoe does not bend at the ball of the foot. Pt states understanding and I will call again with instructions on colchicine.

## 2018-10-24 NOTE — Telephone Encounter (Signed)
Patient had to get r/s for an appointment in our office. He wanted to know if he will need a refill on a prescription and what else he can do in the meantime.

## 2018-10-25 ENCOUNTER — Ambulatory Visit: Payer: Medicare HMO | Admitting: Podiatry

## 2018-10-25 NOTE — Telephone Encounter (Signed)
Continue colchicine °

## 2018-10-25 NOTE — Telephone Encounter (Signed)
I informed pt of Dr. Stephenie Acres orders to continue the colchicine.

## 2018-11-08 ENCOUNTER — Telehealth: Payer: Self-pay | Admitting: *Deleted

## 2018-11-08 NOTE — Telephone Encounter (Signed)
Left message to schedule AWV 

## 2018-12-05 ENCOUNTER — Telehealth: Payer: Self-pay | Admitting: *Deleted

## 2018-12-05 NOTE — Telephone Encounter (Signed)
Schedule AWV.  

## 2018-12-06 ENCOUNTER — Other Ambulatory Visit: Payer: Self-pay

## 2018-12-06 ENCOUNTER — Ambulatory Visit: Payer: Medicare HMO | Admitting: Podiatry

## 2018-12-06 ENCOUNTER — Ambulatory Visit (INDEPENDENT_AMBULATORY_CARE_PROVIDER_SITE_OTHER): Payer: Medicare HMO | Admitting: Emergency Medicine

## 2018-12-06 VITALS — BP 137/87 | Ht 70.0 in | Wt 201.0 lb

## 2018-12-06 DIAGNOSIS — Z0000 Encounter for general adult medical examination without abnormal findings: Secondary | ICD-10-CM

## 2018-12-06 NOTE — Progress Notes (Addendum)
Presents today for [employer] Visit  I connected with  Gerald Fisher on 12/19/18 by a video enabled telemedicine application and verified that I am speaking with the correct person using two identifiers.   IThe patient expressed understanding and agreed to proceed.    Date of last exam: 08-31-2018  Interpreter used for this visit? No Telephone visit unable to do [email or web address]  Patient Care Team: Horald Pollen, MD as PCP - General (Internal Medicine)   Other items to address today:  Immunization discussed Discussed eye/dental yearly exams Patient diet / exercise  BP taken by patient at home    Other Screening: Last screening for diabetes: 08/29/2018  Last lipid screening: 08/29/2018  ADVANCE DIRECTIVES: Discussed:yes On File: no Materials Provided: yes (mailed)  Immunization status:  Immunization History  Administered Date(s) Administered  . Influenza, High Dose Seasonal PF 05/11/2018  . Influenza,inj,Quad PF,6+ Mos 08/17/2016  . Pneumococcal Polysaccharide-23 08/29/2018  . Tdap 08/29/2018     There are no preventive care reminders to display for this patient.   Functional Status Survey: Is the patient deaf or have difficulty hearing?: No Does the patient have difficulty seeing, even when wearing glasses/contacts?: No Does the patient have difficulty concentrating, remembering, or making decisions?: No Does the patient have difficulty walking or climbing stairs?: No Does the patient have difficulty dressing or bathing?: No Does the patient have difficulty doing errands alone such as visiting a doctor's office or shopping?: No   6CIT Screen 12/06/2018  What Year? 0 points  What month? 0 points  What time? 0 points  Count back from 20 0 points  Months in reverse 0 points  Repeat phrase 0 points  Total Score 0        Clinical Support from 12/06/2018 in [place] [facility]  AUDIT-C Score  0       Home Environment:   Lives in  one story home  No trouble climbing stairs No scattered rugs  No grab bars in bathroom.  Patient Active Problem List   Diagnosis Date Noted  . Prediabetes 08/29/2018  . History of gout 08/29/2018  . Colon cancer screening 03/11/2017  . 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 Essential hypertension 05/24/2015     Past Medical History:  Diagnosis Date  . Hypertension      Past Surgical History:  Procedure Laterality Date  . FOOT SURGERY       Family History  Problem Relation Age of Onset  . Heart disease Mother   . Hypertension Father      Social History   Socioeconomic History  . Marital status: Single    Spouse name: Not on file  . Number of children: 2  . Years of education: Not on file  . Highest education level: Not on file  Occupational History  . Not on file  Social Needs  . Financial resource strain: Not on file  . Food insecurity:    Worry: Not on file    Inability: Not on file  . Transportation needs:    Medical: Not on file    Non-medical: Not on file  Tobacco Use  . Smoking status: Never Smoker  . Smokeless tobacco: Never Used  Substance and Sexual Activity  . Alcohol use: Yes  . Drug use: No  . Sexual activity: Yes    Birth control/protection: Condom  Lifestyle  . Physical activity:    Days per week: Not on file  Minutes per session: Not on file  . Stress: Not on file  Relationships  . Social connections:    Talks on phone: Not on file    Gets together: Not on file    Attends religious service: Not on file    Active member of club or organization: Not on file    Attends meetings of clubs or organizations: Not on file    Relationship status: Not on file  . Intimate partner violence:    Fear of current or ex partner: Not on file    Emotionally abused: Not on file    Physically abused: Not on file    Forced sexual activity: Not on file  Other Topics Concern  . Not on file  Social History Narrative  . Not on file      No Known Allergies   Prior to Admission medications   Medication Sig Start Date End Date Taking? Authorizing Provider  aspirin EC 81 MG tablet Take 81 mg by mouth daily.   Yes [provider]  colchicine 0.6 MG tablet Take 1 tablet (0.6 mg total) by mouth 2 (two) times daily. 10/11/18  Yes Hyatt, Max T, DPM  lisinopril (PRINIVIL,ZESTRIL) 20 MG tablet Take 1 tablet (20 mg total) by mouth daily. 08/29/18 12/06/18 Yes Sagardia, Ines Bloomer, MD  rosuvastatin (CRESTOR) 20 MG tablet Take 1 tablet (20 mg total) by mouth daily. 08/30/18  Yes Sagardia, Ines Bloomer, MD  HYDROcodone-acetaminophen (NORCO) 5-325 MG tablet Take 1 tablet by mouth every 6 (six) hours as needed for moderate pain. 08/31/18   Horald Pollen, MD  naproxen sodium (ALEVE) 220 MG tablet Take 220 mg by mouth.    [provider]     Depression screen [facility] 2/9 12/06/2018 08/31/2018 08/29/2018 08/19/2018 08/05/2018  Decreased Interest 0 0 0 0 0  Down, Depressed, Hopeless 0 0 0 0 0  PHQ - 2 Score 0 0 0 0 0     Fall Risk  12/06/2018 08/31/2018 08/31/2018 08/29/2018 08/19/2018  Falls in the past year? 0 0 0 0 0  Number falls in past yr: 0 0 0 - -  Injury with Fall? 0 0 0 - -  Risk for fall due to : Other (Comment) - - - -  Follow up Falls evaluation completed;Education provided Falls evaluation completed Falls evaluation completed - -      PHYSICAL EXAM: BP 137/87   Ht 5\' 10"  (1.778 m)   Wt 201 lb (91.2 kg)   BMI 28.84 kg/m    Wt Readings from Last 3 Encounters:  12/06/18 201 lb (91.2 kg)  08/31/18 201 lb 9.6 oz (91.4 kg)  08/29/18 201 lb 12.8 oz (91.5 kg)     No exam data present    Physical Exam   Education/Counseling provided regarding diet and exercise, prevention of chronic diseases, smoking/tobacco cessation, if applicable, and reviewed "Covered Medicare Preventive Services."   ASSESSMENT/PLAN: There are no diagnoses linked to this encounter.    I have reviewed and agree with the above  AWV documentation. [name], MD

## 2018-12-06 NOTE — Patient Instructions (Signed)
Thank you for taking time to come for your Medicare Wellness Visit. I appreciate your ongoing commitment to your health goals. Please review the following plan we discussed and let me know if I can assist you in the future.    LPN  Healthy Eating Following a healthy eating pattern may help you to achieve and maintain a healthy body weight, reduce the risk of chronic disease, and live a long and productive life. It is important to follow a healthy eating pattern at an appropriate calorie level for your body. Your nutritional needs should be met primarily through food by choosing a variety of nutrient-rich foods. What are tips for following this plan? Reading food labels  Read labels and choose the following: ? Reduced or low sodium. ? Juices with 100% fruit juice. ? Foods with low saturated fats and high polyunsaturated and monounsaturated fats. ? Foods with whole grains, such as whole wheat, cracked wheat, brown rice, and wild rice. ? Whole grains that are fortified with folic acid. This is recommended for women who are pregnant or who want to become pregnant.  Read labels and avoid the following: ? Foods with a lot of added sugars. These include foods that contain brown sugar, corn sweetener, corn syrup, dextrose, fructose, glucose, high-fructose corn syrup, honey, invert sugar, lactose, malt syrup, maltose, molasses, raw sugar, sucrose, trehalose, or turbinado sugar.  Do not eat more than the following amounts of added sugar per day:  6 teaspoons (25 g) for women.  9 teaspoons (38 g) for men. ? Foods that contain processed or refined starches and grains. ? Refined grain products, such as white flour, degermed cornmeal, white bread, and white rice. Shopping  Choose nutrient-rich snacks, such as vegetables, whole fruits, and nuts. Avoid high-calorie and high-sugar snacks, such as potato chips, fruit snacks, and candy.  Use oil-based dressings and spreads on foods instead of  solid fats such as butter, stick margarine, or cream cheese.  Limit pre-made sauces, mixes, and "instant" products such as flavored rice, instant noodles, and ready-made pasta.  Try more plant-protein sources, such as tofu, tempeh, black beans, edamame, lentils, nuts, and seeds.  Explore eating plans such as the Mediterranean diet or vegetarian diet. Cooking  Use oil to saut or stir-fry foods instead of solid fats such as butter, stick margarine, or lard.  Try baking, boiling, grilling, or broiling instead of frying.  Remove the fatty part of meats before cooking.  Steam vegetables in water or broth. Meal planning   At meals, imagine dividing your plate into fourths: ? One-half of your plate is fruits and vegetables. ? One-fourth of your plate is whole grains. ? One-fourth of your plate is protein, especially lean meats, poultry, eggs, tofu, beans, or nuts.  Include low-fat dairy as part of your daily diet. Lifestyle  Choose healthy options in all settings, including home, work, school, restaurants, or stores.  Prepare your food safely: ? Wash your hands after handling raw meats. ? Keep food preparation surfaces clean by regularly washing with hot, soapy water. ? Keep raw meats separate from ready-to-eat foods, such as fruits and vegetables. ? Cook seafood, meat, poultry, and eggs to the recommended internal temperature. ? Store foods at safe temperatures. In general:  Keep cold foods at 40F (4.4C) or below.  Keep hot foods at 140F (60C) or above.  Keep your freezer at 0F (-17.8C) or below.  Foods are no longer safe to eat when they have been between the temperatures of 40-140F (4.4-60C) for   more than 2 hours. What foods should I eat? Fruits Aim to eat 2 cup-equivalents of fresh, canned (in natural juice), or frozen fruits each day. Examples of 1 cup-equivalent of fruit include 1 small apple, 8 large strawberries, 1 cup canned fruit,  cup dried fruit, or 1 cup  100% juice. Vegetables Aim to eat 2-3 cup-equivalents of fresh and frozen vegetables each day, including different varieties and colors. Examples of 1 cup-equivalent of vegetables include 2 medium carrots, 2 cups raw, leafy greens, 1 cup chopped vegetable (raw or cooked), or 1 medium baked potato. Grains Aim to eat 6 ounce-equivalents of whole grains each day. Examples of 1 ounce-equivalent of grains include 1 slice of bread, 1 cup ready-to-eat cereal, 3 cups popcorn, or  cup cooked rice, pasta, or cereal. Meats and other proteins Aim to eat 5-6 ounce-equivalents of protein each day. Examples of 1 ounce-equivalent of protein include 1 egg, 1/2 cup nuts or seeds, or 1 tablespoon (16 g) peanut butter. A cut of meat or fish that is the size of a deck of cards is about 3-4 ounce-equivalents.  Of the protein you eat each week, try to have at least 8 ounces come from seafood. This includes salmon, trout, herring, and anchovies. Dairy Aim to eat 3 cup-equivalents of fat-free or low-fat dairy each day. Examples of 1 cup-equivalent of dairy include 1 cup (240 mL) milk, 8 ounces (250 g) yogurt, 1 ounces (44 g) natural cheese, or 1 cup (240 mL) fortified soy milk. Fats and oils  Aim for about 5 teaspoons (21 g) per day. Choose monounsaturated fats, such as canola and olive oils, avocados, peanut butter, and most nuts, or polyunsaturated fats, such as sunflower, corn, and soybean oils, walnuts, pine nuts, sesame seeds, sunflower seeds, and flaxseed. Beverages  Aim for six 8-oz glasses of water per day. Limit coffee to three to five 8-oz cups per day.  Limit caffeinated beverages that have added calories, such as soda and energy drinks.  Limit alcohol intake to no more than 1 drink a day for nonpregnant women and 2 drinks a day for men. One drink equals 12 oz of beer (355 mL), 5 oz of wine (148 mL), or 1 oz of hard liquor (44 mL). Seasoning and other foods  Avoid adding excess amounts of salt to your  foods. Try flavoring foods with herbs and spices instead of salt.  Avoid adding sugar to foods.  Try using oil-based dressings, sauces, and spreads instead of solid fats. This information is based on general [place] nutrition guidelines. For more information, visit choosemyplate.gov. Exact amounts may vary based on your nutrition needs. Summary  A healthy eating plan may help you to maintain a healthy weight, reduce the risk of chronic diseases, and stay active throughout your life.  Plan your meals. Make sure you eat the right portions of a variety of nutrient-rich foods.  Try baking, boiling, grilling, or broiling instead of frying.  Choose healthy options in all settings, including home, work, school, restaurants, or stores. This information is not intended to replace advice given to you by your health care provider. Make sure you discuss any questions you have with your health care provider. Document Released: 11/01/2017 Document Revised: 11/01/2017 Document Reviewed: 11/01/2017 Elsevier Interactive Patient Education  2019 Elsevier Inc.  

## 2018-12-13 ENCOUNTER — Other Ambulatory Visit: Payer: Self-pay

## 2018-12-13 ENCOUNTER — Ambulatory Visit: Payer: Medicare HMO | Admitting: Podiatry

## 2018-12-13 ENCOUNTER — Encounter: Payer: Self-pay | Admitting: Podiatry

## 2018-12-13 VITALS — Temp 97.7°F

## 2018-12-13 DIAGNOSIS — M779 Enthesopathy, unspecified: Secondary | ICD-10-CM

## 2018-12-13 DIAGNOSIS — M7751 Other enthesopathy of right foot: Secondary | ICD-10-CM

## 2018-12-13 DIAGNOSIS — M778 Other enthesopathies, not elsewhere classified: Secondary | ICD-10-CM

## 2018-12-13 NOTE — Progress Notes (Signed)
Gerald Fisher presents today for follow-up of his painful gouty capsulitis first metatarsophalangeal joint of the right foot states that is completely gone at this point.  He has started to develop some ankle pain of the right anterior ankle.  He is also wanting to know whether or not he should continue his colchicine twice daily.  Objective: Vital signs are stable he is alert oriented x3.  Pulses are palpable.  At this point he has no pain on range of motion of the first metatarsal phalangeal joint he does have pain on palpation of the anterior lateral gutter of the ankle.  No crepitation on range of motion minimal ankle instability.  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  Capsulitis ankle right possible early osteoarthritic changes.  Plan: At this point I highly recommended not taking 2 of the colchicine daily but dropping it down to 1 a day.  I also injected the anterior and lateral portion of the ankle after sterile Betadine skin prep with 20 mg Kenalog 5 mg Marcaine.  He tolerated procedure well without complications.  We also discussed appropriate shoe gear stretching exercises and ice therapy.  I will follow-up with him in 2 months just to reevaluate that ankle and to make sure he is doing okay with the reduction in colchicine.

## 2019-01-16 ENCOUNTER — Other Ambulatory Visit: Payer: Self-pay | Admitting: Podiatry

## 2019-02-07 ENCOUNTER — Telehealth (INDEPENDENT_AMBULATORY_CARE_PROVIDER_SITE_OTHER): Payer: Medicare HMO | Admitting: Emergency Medicine

## 2019-02-07 ENCOUNTER — Encounter: Payer: Self-pay | Admitting: Emergency Medicine

## 2019-02-07 ENCOUNTER — Other Ambulatory Visit: Payer: Self-pay

## 2019-02-07 VITALS — Ht 70.0 in | Wt 195.0 lb

## 2019-02-07 DIAGNOSIS — I10 Essential (primary) hypertension: Secondary | ICD-10-CM | POA: Diagnosis not present

## 2019-02-07 DIAGNOSIS — Z8739 Personal history of other diseases of the musculoskeletal system and connective tissue: Secondary | ICD-10-CM | POA: Diagnosis not present

## 2019-02-07 DIAGNOSIS — E785 Hyperlipidemia, unspecified: Secondary | ICD-10-CM | POA: Diagnosis not present

## 2019-02-07 MED ORDER — ROSUVASTATIN CALCIUM 20 MG PO TABS: 20.0000 mg | ORAL_TABLET | Freq: Every day | ORAL | 3 refills | Status: DC

## 2019-02-07 NOTE — Progress Notes (Signed)
Wt Readings from Last 3 Encounters:  02/07/19 195 lb (88.5 kg)  12/06/18 201 lb (91.2 kg)  08/31/18 201 lb 9.6 oz (91.4 kg)      Telemedicine Encounter- SOAP NOTE Established Patient  This telephone encounter was conducted with the patient's (or proxy's) verbal consent via audio telecommunications: yes/no: Yes Patient was instructed to have this encounter in a suitably private space; and to only have persons present to whom they give permission to participate. In addition, patient identity was confirmed by use of name plus two identifiers (DOB and address).  I discussed the limitations, risks, security and privacy concerns of performing an evaluation and management service by telephone and the availability of in person appointments. I also discussed with the patient that there may be a patient responsible charge related to this service. The patient expressed understanding and agreed to proceed.  I spent a total of TIME; 0 MIN TO 60 MIN: 15 minutes talking with the patient or their proxy.  No chief complaint on file. Follow-up on chronic medical problems  Subjective   Gerald Fisher is a 68 y.o. male established patient. Telephone visit today for follow-up of chronic medical problems: 1.  Hypertension: Blood pressures at home 150s over 80s, on lisinopril 20 mg a day. 2.  History of gout: No longer taking colchicine.  Has seen the podiatrist twice.  Given corticosteroid injections with good results.  Feels a lot better.  No issues. 3.  Dyslipidemia: On Crestor 20 mg a day.  Doing well.  Eating better. No other complaints or medical concerns today.  HPI   Patient Active Problem List   Diagnosis Date Noted  . Prediabetes 08/29/2018  . History of gout 08/29/2018  . Essential hypertension 05/24/2015    Past Medical History:  Diagnosis Date  . Hypertension     Current Outpatient Medications  Medication Sig Dispense Refill  . aspirin EC 81 MG tablet Take 81 mg by mouth daily.    Marland Kitchen  COLCRYS 0.6 MG tablet TAKE 1 TABLET (0.6 MG TOTAL) BY MOUTH 2 (TWO) TIMES DAILY. 90 tablet 0  . rosuvastatin (CRESTOR) 20 MG tablet Take 1 tablet (20 mg total) by mouth daily. 90 tablet 3  . HYDROcodone-acetaminophen (NORCO) 5-325 MG tablet Take 1 tablet by mouth every 6 (six) hours as needed for moderate pain. (Patient not taking: Reported on 02/07/2019) 10 tablet 0  . lisinopril (PRINIVIL,ZESTRIL) 20 MG tablet Take 1 tablet (20 mg total) by mouth daily. 90 tablet 3  . naproxen sodium (ALEVE) 220 MG tablet Take 220 mg by mouth.     No current facility-administered medications for this visit.     No Known Allergies  Social History   Socioeconomic History  . Marital status: Single    Spouse name: Not on file  . Number of children: 2  . Years of education: Not on file  . Highest education level: Not on file  Occupational History  . Not on file  Social Needs  . Financial resource strain: Not on file  . Food insecurity    Worry: Not on file    Inability: Not on file  . Transportation needs    Medical: Not on file    Non-medical: Not on file  Tobacco Use  . Smoking status: Never Smoker  . Smokeless tobacco: Never Used  Substance and Sexual Activity  . Alcohol use: Yes  . Drug use: No  . Sexual activity: Yes    Birth control/protection: Condom  Lifestyle  .  Physical activity    Days per week: Not on file    Minutes per session: Not on file  . Stress: Not on file  Relationships  . Social Herbalist on phone: Not on file    Gets together: Not on file    Attends religious service: Not on file    Active member of club or organization: Not on file    Attends meetings of clubs or organizations: Not on file    Relationship status: Not on file  . Intimate partner violence    Fear of current or ex partner: Not on file    Emotionally abused: Not on file    Physically abused: Not on file    Forced sexual activity: Not on file  Other Topics Concern  . Not on file   Social History Narrative  . Not on file    Review of Systems  Constitutional: Negative.  Negative for chills and fever.  HENT: Negative.  Negative for congestion, nosebleeds and sore throat.   Eyes: Negative.   Respiratory: Negative.  Negative for cough and shortness of breath.   Cardiovascular: Negative.  Negative for chest pain and palpitations.  Gastrointestinal: Negative.  Negative for abdominal pain, diarrhea, nausea and vomiting.  Genitourinary: Negative.   Musculoskeletal: Negative.   Skin: Negative.  Negative for rash.  Neurological: Negative for dizziness and headaches.  All other systems reviewed and are negative.   Objective   Vitals as reported by the patient: Today's Vitals   02/07/19 1353  Weight: 195 lb (88.5 kg)  Height: 5\' 10"  (1.778 m)  Alert oriented x3 no apparent respiratory distress.  Diagnoses and all orders for this visit:  Essential hypertension  Dyslipidemia -     rosuvastatin (CRESTOR) 20 MG tablet; Take 1 tablet (20 mg total) by mouth daily.  History of gout     Clinically stable.  No medical concerns identified during this visit. Continue present medication.  Advised to increase lisinopril to 30 to 40 mg a day and continue monitoring blood pressure at home. Office visit in 3 months.   I discussed the assessment and treatment plan with the patient. The patient was provided an opportunity to ask questions and all were answered. The patient agreed with the plan and demonstrated an understanding of the instructions.   The patient was advised to call back or seek an in-person evaluation if the symptoms worsen or if the condition fails to improve as anticipated.  I provided 15 minutes of non-face-to-face time during this encounter.  Horald Pollen, MD  Primary Care at [facility]

## 2019-02-07 NOTE — Progress Notes (Signed)
Called patient to triage for appointment. Patient states is following as usual for his appointment.  Patient states he was referred to a Podiatry for gout of right foot and he was seen at [facility]. Patient states he had 2 injections of Corti steroid to the right foot. Patient states should he continue to take Crestor, he will discuss during his telephone appointment.

## 2019-02-09 NOTE — Progress Notes (Signed)
LVM to schedule appt

## 2019-02-14 ENCOUNTER — Ambulatory Visit: Payer: Medicare HMO | Admitting: Podiatry

## 2019-02-14 ENCOUNTER — Other Ambulatory Visit: Payer: Self-pay

## 2019-02-14 ENCOUNTER — Encounter: Payer: Self-pay | Admitting: Podiatry

## 2019-02-14 VITALS — Temp 97.6°F

## 2019-02-14 DIAGNOSIS — M1A0710 Idiopathic chronic gout, right ankle and foot, without tophus (tophi): Secondary | ICD-10-CM

## 2019-02-14 MED ORDER — COLCHICINE 0.6 MG PO TABS: ORAL_TABLET | ORAL | 3 refills | Status: DC

## 2019-02-14 NOTE — Progress Notes (Signed)
He presents today for follow-up of his gout capsulitis states that he is 100% better compared to where he was 3 months ago.  He wonders if he should continue his colchicine daily.  He states that he was doing well with that and we decreased it from 2 a day to 1 a day at our last visit.  Objective: Vital signs are stable he is alert and oriented x3.  Pulses are palpable.  Is no erythema edema cellulitis drainage odor he has no pain on palpation of the ankle or of the first metatarsal phalangeal joint right.  Assessment: Resolving gouty arthritis.  Plan: He will notify us with any questions or concerns he will continue the use of Colcrys or colchicine 0.6 mg 1 daily.  I will follow-up with him on an as-needed basis.  Should this patient ever called complaining of urgent severe pain in the foot I am requesting that we write a requisition for an arthritic panel and a CBC.

## 2019-04-25 DIAGNOSIS — H524 Presbyopia: Secondary | ICD-10-CM | POA: Diagnosis not present

## 2019-06-07 ENCOUNTER — Encounter (HOSPITAL_COMMUNITY): Payer: Self-pay

## 2019-06-07 ENCOUNTER — Other Ambulatory Visit: Payer: Self-pay

## 2019-06-07 ENCOUNTER — Ambulatory Visit (HOSPITAL_COMMUNITY)
Admission: EM | Admit: 2019-06-07 | Discharge: 2019-06-07 | Disposition: A | Discharge status: Home / Self Care | Payer: Medicare HMO | Attending: Family Medicine | Admitting: Family Medicine

## 2019-06-07 DIAGNOSIS — L2389 Allergic contact dermatitis due to other agents: Secondary | ICD-10-CM | POA: Diagnosis not present

## 2019-06-07 MED ORDER — METHYLPREDNISOLONE ACETATE 40 MG/ML IJ SUSP
40.0000 mg | Freq: Once | INTRAMUSCULAR | Status: AC
  Administered 2019-06-07: 40 mg via INTRAMUSCULAR

## 2019-06-07 MED ORDER — METHYLPREDNISOLONE ACETATE 80 MG/ML IJ SUSP
INTRAMUSCULAR | Status: AC
  Filled 2019-06-07: qty 1

## 2019-06-07 MED ORDER — PREDNISONE 10 MG PO TABS: 20.0000 mg | ORAL_TABLET | Freq: Every day | ORAL | 0 refills | Status: DC
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
## 2019-06-07 NOTE — ED Triage Notes (Signed)
Pt cc he thinks he got into some poison ivy over the weekend. Pt states he has a rash on his arms, face, and back. Pt states his used some benadryl for relief. Pt states he's still itching.

## 2019-08-06 DIAGNOSIS — Z20828 Contact with and (suspected) exposure to other viral communicable diseases: Secondary | ICD-10-CM | POA: Diagnosis not present

## 2019-08-31 ENCOUNTER — Other Ambulatory Visit: Payer: Self-pay

## 2019-08-31 ENCOUNTER — Other Ambulatory Visit: Payer: Self-pay | Admitting: Emergency Medicine

## 2019-08-31 NOTE — Telephone Encounter (Signed)
Forwarding medication refill request to the clinical pool for review. 

## 2019-08-31 NOTE — Telephone Encounter (Signed)
No further refills without office visit 

## 2019-09-24 ENCOUNTER — Other Ambulatory Visit: Payer: Self-pay | Admitting: Emergency Medicine

## 2019-10-31 ENCOUNTER — Other Ambulatory Visit: Payer: Self-pay | Admitting: Emergency Medicine

## 2019-10-31 NOTE — Telephone Encounter (Signed)
Courtesy fill-please call office to make appt Requested Prescriptions  Pending Prescriptions Disp Refills  . lisinopril (ZESTRIL) 10 MG tablet [Pharmacy Med Name: LISINOPRIL 10 MG TABLET] 60 tablet 0    Sig: TAKE 2 TABLETS BY MOUTH EVERY DAY     Cardiovascular:  ACE Inhibitors Failed - 10/31/2019  9:30 AM      Failed - Cr in normal range and within 180 days    Creat  Date Value Ref Range Status  05/24/2015 1.21 0.70 - 1.25 mg/dL Final   Creatinine, Ser  Date Value Ref Range Status  08/29/2018 1.49 (H) 0.76 - 1.27 mg/dL Final         Failed - K in normal range and within 180 days    Potassium  Date Value Ref Range Status  08/29/2018 4.3 3.5 - 5.2 mmol/L Final         Failed - Last BP in normal range    BP Readings from Last 1 Encounters:  06/07/19 (!) 188/68         Failed - Valid encounter within last 6 months    Recent Outpatient Visits          8 months ago Essential hypertension   [facility] [place], Ines Bloomer, MD   10 months ago Medicare annual wellness visit, subsequent   [facility] [place], Ines Bloomer, MD   1 year ago Pain of toe of left foot   [facility], Ines Bloomer, MD   1 year ago Essential hypertension   [facility] [place], [place], MD   1 year ago Gout of multiple sites, unspecified cause, unspecified chronicity   [facility], Ranell [facility], MD             Passed - Patient is not pregnant

## 2019-12-02 ENCOUNTER — Other Ambulatory Visit: Payer: Self-pay | Admitting: Emergency Medicine

## 2019-12-02 NOTE — Telephone Encounter (Signed)
Requested medication (s) are due for refill today: yes  Requested medication (s) are on the active medication list: yes  Last refill:  10/31/19 courtesy refill  Future visit scheduled: no  Notes to clinic:  Pt overdue for OV- [facility] no longer makes appts for this practice   Requested Prescriptions  Pending Prescriptions Disp Refills   lisinopril (ZESTRIL) 10 MG tablet [Pharmacy Med Name: LISINOPRIL 10 MG TABLET] 60 tablet 0    Sig: TAKE 2 TABLETS BY MOUTH EVERY DAY      Cardiovascular:  ACE Inhibitors Failed - 12/02/2019  2:33 PM      Failed - Cr in normal range and within 180 days    Creat  Date Value Ref Range Status  05/24/2015 1.21 0.70 - 1.25 mg/dL Final   Creatinine, Ser  Date Value Ref Range Status  08/29/2018 1.49 (H) 0.76 - 1.27 mg/dL Final          Failed - K in normal range and within 180 days    Potassium  Date Value Ref Range Status  08/29/2018 4.3 3.5 - 5.2 mmol/L Final          Failed - Last BP in normal range    BP Readings from Last 1 Encounters:  06/07/19 (!) 188/68          Failed - Valid encounter within last 6 months    Recent Outpatient Visits           9 months ago Essential hypertension   [facility] [place], Ines Bloomer, MD   12 months ago Medicare annual wellness visit, subsequent   [facility], [place], MD   1 year ago Pain of toe of left foot   [facility] [place], Ines Bloomer, MD   1 year ago Essential hypertension   [facility] [place], [place], MD   1 year ago Gout of multiple sites, unspecified cause, unspecified chronicity   [facility], Ranell Patrick, MD              Passed - Patient is not pregnant

## 2019-12-25 ENCOUNTER — Telehealth: Payer: Self-pay | Admitting: *Deleted

## 2019-12-25 NOTE — Telephone Encounter (Signed)
Schedule AWV.  

## 2020-01-02 ENCOUNTER — Other Ambulatory Visit: Payer: Self-pay | Admitting: Emergency Medicine

## 2020-01-03 ENCOUNTER — Telehealth: Payer: Self-pay

## 2020-01-03 NOTE — Telephone Encounter (Signed)
Called patient and left a VM to schedule an office visit.

## 2020-01-22 ENCOUNTER — Ambulatory Visit (INDEPENDENT_AMBULATORY_CARE_PROVIDER_SITE_OTHER): Payer: Medicare HMO | Admitting: Emergency Medicine

## 2020-01-22 ENCOUNTER — Other Ambulatory Visit: Payer: Self-pay

## 2020-01-22 ENCOUNTER — Encounter: Payer: Self-pay | Admitting: Emergency Medicine

## 2020-01-22 VITALS — BP 169/75 | HR 53 | Temp 97.7°F | Resp 18 | Ht 70.0 in | Wt 206.2 lb

## 2020-01-22 DIAGNOSIS — Z8739 Personal history of other diseases of the musculoskeletal system and connective tissue: Secondary | ICD-10-CM

## 2020-01-22 DIAGNOSIS — I10 Essential (primary) hypertension: Secondary | ICD-10-CM

## 2020-01-22 MED ORDER — COLCHICINE 0.6 MG PO TABS: ORAL_TABLET | ORAL | 3 refills | Status: DC

## 2020-01-22 MED ORDER — LISINOPRIL 40 MG PO TABS: 40.0000 mg | ORAL_TABLET | Freq: Every day | ORAL | 3 refills | Status: DC

## 2020-01-22 NOTE — Assessment & Plan Note (Signed)
Elevated blood pressure.  Will increase lisinopril to 40 mg daily.  Continue rosuvastatin 20 mg daily. Follow-up in 6 months.

## 2020-01-22 NOTE — Progress Notes (Signed)
Gerald Fisher 69 y.o.   Chief Complaint  Patient presents with   Medication Refill    patient states he needs a medication on Linsinopril and also wanted to discuss getting more Colchicine for gout flare up he states he had a flare up last week and could barely walk , and had a few colchicine left and it worked for him.    HISTORY OF PRESENT ILLNESS: This is a 69 y.o. male with history of hypertension and dyslipidemia here for follow-up and medication refill. Blood pressure readings at home have been on the high side.  Also has a history of gout and takes colchicine as needed. Takes rosuvastatin 20 mg daily.  No complaints or medical concerns today. BP Readings from Last 3 Encounters:  01/22/20 (!) 169/75  06/07/19 (!) 188/68  12/06/18 137/87    HPI   Prior to Admission medications   Medication Sig Start Date End Date Taking? Authorizing Provider  aspirin EC 81 MG tablet Take 81 mg by mouth daily.   Yes [provider]  colchicine 0.6 MG tablet Take 1 tablet TID until GI upset or flare subsides, then for maintenance, take 1 tablet daily. 02/14/19  Yes Hyatt, Max T, DPM  lisinopril (ZESTRIL) 10 MG tablet TAKE 2 TABLETS BY MOUTH EVERY DAY 01/03/20  Yes Bardia Wangerin, Ines Bloomer, MD  rosuvastatin (CRESTOR) 20 MG tablet Take 1 tablet (20 mg total) by mouth daily. 02/07/19  Yes Samira Acero, Ines Bloomer, MD  FLUZONE HIGH-DOSE QUADRIVALENT 0.7 ML SUSY  05/26/19   [provider]  HYDROcodone-acetaminophen (NORCO) 5-325 MG tablet Take 1 tablet by mouth every 6 (six) hours as needed for moderate pain. Patient not taking: Reported on 02/07/2019 08/31/18   Horald Pollen, MD  lisinopril (PRINIVIL,ZESTRIL) 20 MG tablet Take 1 tablet (20 mg total) by mouth daily. 08/29/18 12/06/18  Horald Pollen, MD  naproxen sodium (ALEVE) 220 MG tablet Take 220 mg by mouth. Patient not taking: Reported on 01/22/2020    [provider]  predniSONE (DELTASONE) 10 MG tablet Take 2  tablets (20 mg total) by mouth daily. Patient not taking: Reported on 01/22/2020 06/07/19   Wardell Honour, MD    No Known Allergies  Patient Active Problem List   Diagnosis Date Noted   Prediabetes 08/29/2018   History of gout 08/29/2018   Essential hypertension 05/24/2015    Past Medical History:  Diagnosis Date   Hypertension     Past Surgical History:  Procedure Laterality Date   FOOT SURGERY      Social History   Socioeconomic History   Marital status: Single    Spouse name: Not on file   Number of children: 2   Years of education: Not on file   Highest education level: Not on file  Occupational History   Not on file  Tobacco Use   Smoking status: Never Smoker   Smokeless tobacco: Never Used  Vaping Use   Vaping Use: Never used  Substance and Sexual Activity   Alcohol use: Yes   Drug use: No   Sexual activity: Yes    Birth control/protection: Condom  Other Topics Concern   Not on file  Social History Narrative   Not on file   Social Determinants of Health   Financial Resource Strain:    Difficulty of Paying Living Expenses:   Food Insecurity:    Worried About [place] in the Last Year:    [place] in the Last  Year:   Transportation Needs:    Film/video editor (Medical):    Lack of Transportation (Non-Medical):   Physical Activity:    Days of Exercise per Week:    Minutes of Exercise per Session:   Stress:    Feeling of Stress :   Social Connections:    Frequency of Communication with Friends and Family:    Frequency of Social Gatherings with Friends and Family:    Attends Religious Services:    Active Member of Clubs or Organizations:    Attends Music therapist:    Marital Status:   Intimate Partner Violence:    Fear of Current or Ex-Partner:    Emotionally Abused:    Physically Abused:    Sexually Abused:     Family History  Problem Relation Age of Onset    Heart disease Mother    Hypertension Father      Review of Systems  Constitutional: Negative.  Negative for chills and fever.  HENT: Negative.  Negative for congestion and sore throat.   Respiratory: Negative.  Negative for cough and shortness of breath.   Cardiovascular: Negative.  Negative for chest pain and palpitations.  Gastrointestinal: Negative.  Negative for abdominal pain, diarrhea, nausea and vomiting.  Genitourinary: Negative.  Negative for dysuria and hematuria.  Musculoskeletal: Negative for back pain, myalgias and neck pain.  Skin: Negative.  Negative for rash.  Neurological: Negative.  Negative for dizziness and headaches.  All other systems reviewed and are negative.    Vitals:   01/22/20 1652  BP: (!) 169/75  Pulse: (!) 53  Resp: 18  Temp: 97.7 F (36.5 C)  SpO2: 98%     Physical Exam Vitals reviewed.  Constitutional:      Appearance: Normal appearance.  HENT:     Head: Normocephalic.  Eyes:     Extraocular Movements: Extraocular movements intact.     Conjunctiva/sclera: Conjunctivae normal.     Pupils: Pupils are equal, round, and reactive to light.  Cardiovascular:     Rate and Rhythm: Normal rate and regular rhythm.     Pulses: Normal pulses.     Heart sounds: Normal heart sounds.  Pulmonary:     Effort: Pulmonary effort is normal.     Breath sounds: Normal breath sounds.  Musculoskeletal:        General: Normal range of motion.     Cervical back: Normal range of motion and neck supple.     Right lower leg: No edema.     Left lower leg: No edema.  Skin:    General: Skin is warm and dry.     Capillary Refill: Capillary refill takes less than 2 seconds.  Neurological:     General: No focal deficit present.     Mental Status: He is alert and oriented to person, place, and time.  Psychiatric:        Mood and Affect: Mood normal.        Behavior: Behavior normal.    A total of 30 minutes was spent with the patient, greater than 50% of  which was in counseling/coordination of care regarding hypertension and cardiovascular risks associated with this condition, review of most recent office visit notes, review of most recent blood work results, review of all medications and dose increase of lisinopril, proper use of colchicine when having gout flareups, diet and nutrition, prognosis and need for follow-up.   ASSESSMENT & PLAN: Essential hypertension Elevated blood pressure.  Will increase lisinopril to  40 mg daily.  Continue rosuvastatin 20 mg daily. Follow-up in 6 months.  Gerald Fisher was seen today for medication refill.  Diagnoses and all orders for this visit:  Essential hypertension -     CBC with Differential -     Hemoglobin A1c -     Comprehensive metabolic panel -     Lipid panel -     lisinopril (ZESTRIL) 40 MG tablet; Take 1 tablet (40 mg total) by mouth daily.  History of gout -     colchicine 0.6 MG tablet; Take 1.2 mg now and 0.6 mg one hour later. Take 0.6 mg daily after that x 5 days.    Patient Instructions       If you have lab work done today you will be contacted with your lab results within the next 2 weeks.  If you have not heard from [place] then please contact us. The fastest way to get your results is to register for My Chart.   IF you received an x-ray today, you will receive an invoice from [facility]. Please contact [facility] at [phone] with questions or concerns regarding your invoice.   IF you received labwork today, you will receive an invoice from [place]. Please contact [facility] at [phone] with questions or concerns regarding your invoice.   Our billing staff will not be able to assist you with questions regarding bills from these companies.  You will be contacted with the lab results as soon as they are available. The fastest way to get your results is to activate your My Chart account. Instructions are located on the last page of this paperwork. If you have  not heard from [place] regarding the results in 2 weeks, please contact this office.      Hypertension, Adult High blood pressure (hypertension) is when the force of blood pumping through the arteries is too strong. The arteries are the blood vessels that carry blood from the heart throughout the body. Hypertension forces the heart to work harder to pump blood and may cause arteries to become narrow or stiff. Untreated or uncontrolled hypertension can cause a heart attack, heart failure, a stroke, kidney disease, and other problems. A blood pressure reading consists of a higher number over a lower number. Ideally, your blood pressure should be below 120/80. The first ("top") number is called the systolic pressure. It is a measure of the pressure in your arteries as your heart beats. The second ("bottom") number is called the diastolic pressure. It is a measure of the pressure in your arteries as the heart relaxes. What are the causes? The exact cause of this condition is not known. There are some conditions that result in or are related to high blood pressure. What increases the risk? Some risk factors for high blood pressure are under your control. The following factors may make you more likely to develop this condition:  Smoking.  Having type 2 diabetes mellitus, high cholesterol, or both.  Not getting enough exercise or physical activity.  Being overweight.  Having too much fat, sugar, calories, or salt (sodium) in your diet.  Drinking too much alcohol. Some risk factors for high blood pressure may be difficult or impossible to change. Some of these factors include:  Having chronic kidney disease.  Having a family history of high blood pressure.  Age. Risk increases with age.  Race. You may be at higher risk if you are African American.  Gender. Men are at higher risk than women before  age 19. After age 51, women are at higher risk than men.  Having obstructive sleep  apnea.  Stress. What are the signs or symptoms? High blood pressure may not cause symptoms. Very high blood pressure (hypertensive crisis) may cause:  Headache.  Anxiety.  Shortness of breath.  Nosebleed.  Nausea and vomiting.  Vision changes.  Severe chest pain.  Seizures. How is this diagnosed? This condition is diagnosed by measuring your blood pressure while you are seated, with your arm resting on a flat surface, your legs uncrossed, and your feet flat on the floor. The cuff of the blood pressure monitor will be placed directly against the skin of your upper arm at the level of your heart. It should be measured at least twice using the same arm. Certain conditions can cause a difference in blood pressure between your right and left arms. Certain factors can cause blood pressure readings to be lower or higher than normal for a short period of time:  When your blood pressure is higher when you are in a health care provider's office than when you are at home, this is called white coat hypertension. Most people with this condition do not need medicines.  When your blood pressure is higher at home than when you are in a health care provider's office, this is called masked hypertension. Most people with this condition may need medicines to control blood pressure. If you have a high blood pressure reading during one visit or you have normal blood pressure with other risk factors, you may be asked to:  Return on a different day to have your blood pressure checked again.  Monitor your blood pressure at home for 1 week or longer. If you are diagnosed with hypertension, you may have other blood or imaging tests to help your health care provider understand your overall risk for other conditions. How is this treated? This condition is treated by making healthy lifestyle changes, such as eating healthy foods, exercising more, and reducing your alcohol intake. Your health care provider may  prescribe medicine if lifestyle changes are not enough to get your blood pressure under control, and if:  Your systolic blood pressure is above 130.  Your diastolic blood pressure is above 80. Your personal target blood pressure may vary depending on your medical conditions, your age, and other factors. Follow these instructions at home: Eating and drinking   Eat a diet that is high in fiber and potassium, and low in sodium, added sugar, and fat. An example eating plan is called the DASH (Dietary Approaches to Stop Hypertension) diet. To eat this way: ? Eat plenty of fresh fruits and vegetables. Try to fill one half of your plate at each meal with fruits and vegetables. ? Eat whole grains, such as whole-wheat pasta, brown rice, or whole-grain bread. Fill about one fourth of your plate with whole grains. ? Eat or drink low-fat dairy products, such as skim milk or low-fat yogurt. ? Avoid fatty cuts of meat, processed or cured meats, and poultry with skin. Fill about one fourth of your plate with lean proteins, such as fish, chicken without skin, beans, eggs, or tofu. ? Avoid pre-made and processed foods. These tend to be higher in sodium, added sugar, and fat.  Reduce your daily sodium intake. Most people with hypertension should eat less than 1,500 mg of sodium a day.  Do not drink alcohol if: ? Your health care provider tells you not to drink. ? You are pregnant, may be  pregnant, or are planning to become pregnant.  If you drink alcohol: ? Limit how much you use to:  0-1 drink a day for women.  0-2 drinks a day for men. ? Be aware of how much alcohol is in your drink. In the [place] one drink equals one 12 oz bottle of beer (355 mL), one 5 oz glass of wine (148 mL), or one 1 oz glass of hard liquor (44 mL). Lifestyle   Work with your health care provider to maintain a healthy body weight or to lose weight. Ask what an ideal weight is for you.  Get at least 30 minutes of exercise  most days of the week. Activities may include walking, swimming, or biking.  Include exercise to strengthen your muscles (resistance exercise), such as Pilates or lifting weights, as part of your weekly exercise routine. Try to do these types of exercises for 30 minutes at least 3 days a week.  Do not use any products that contain nicotine or tobacco, such as cigarettes, e-cigarettes, and chewing tobacco. If you need help quitting, ask your health care provider.  Monitor your blood pressure at home as told by your health care provider.  Keep all follow-up visits as told by your health care provider. This is important. Medicines  Take over-the-counter and prescription medicines only as told by your health care provider. Follow directions carefully. Blood pressure medicines must be taken as prescribed.  Do not skip doses of blood pressure medicine. Doing this puts you at risk for problems and can make the medicine less effective.  Ask your health care provider about side effects or reactions to medicines that you should watch for. Contact a health care provider if you:  Think you are having a reaction to a medicine you are taking.  Have headaches that keep coming back (recurring).  Feel dizzy.  Have swelling in your ankles.  Have trouble with your vision. Get help right away if you:  Develop a severe headache or confusion.  Have unusual weakness or numbness.  Feel faint.  Have severe pain in your chest or abdomen.  Vomit repeatedly.  Have trouble breathing. Summary  Hypertension is when the force of blood pumping through your arteries is too strong. If this condition is not controlled, it may put you at risk for serious complications.  Your personal target blood pressure may vary depending on your medical conditions, your age, and other factors. For most people, a normal blood pressure is less than 120/80.  Hypertension is treated with lifestyle changes, medicines, or a  combination of both. Lifestyle changes include losing weight, eating a healthy, low-sodium diet, exercising more, and limiting alcohol. This information is not intended to replace advice given to you by your health care provider. Make sure you discuss any questions you have with your health care provider. Document Revised: 03/30/2018 Document Reviewed: 03/30/2018 Elsevier Patient Education  2020 [employer].      [name], MD [facility]

## 2020-01-22 NOTE — Patient Instructions (Addendum)
   If you have lab work done today you will be contacted with your lab results within the next 2 weeks.  If you have not heard from us then please contact us. The fastest way to get your results is to register for My Chart.   IF you received an x-ray today, you will receive an invoice from [facility]. Please contact [facility] at [phone] with questions or concerns regarding your invoice.   IF you received labwork today, you will receive an invoice from [facility]. Please contact [facility] at [phone] with questions or concerns regarding your invoice.   Our billing staff will not be able to assist you with questions regarding bills from these companies.  You will be contacted with the lab results as soon as they are available. The fastest way to get your results is to activate your My Chart account. Instructions are located on the last page of this paperwork. If you have not heard from us regarding the results in 2 weeks, please contact this office.      Hypertension, Adult High blood pressure (hypertension) is when the force of blood pumping through the arteries is too strong. The arteries are the blood vessels that carry blood from the heart throughout the body. Hypertension forces the heart to work harder to pump blood and may cause arteries to become narrow or stiff. Untreated or uncontrolled hypertension can cause a heart attack, heart failure, a stroke, kidney disease, and other problems. A blood pressure reading consists of a higher number over a lower number. Ideally, your blood pressure should be below 120/80. The first ("top") number is called the systolic pressure. It is a measure of the pressure in your arteries as your heart beats. The second ("bottom") number is called the diastolic pressure. It is a measure of the pressure in your arteries as the heart relaxes. What are the causes? The exact cause of this condition is not known. There are some conditions  that result in or are related to high blood pressure. What increases the risk? Some risk factors for high blood pressure are under your control. The following factors may make you more likely to develop this condition:  Smoking.  Having type 2 diabetes mellitus, high cholesterol, or both.  Not getting enough exercise or physical activity.  Being overweight.  Having too much fat, sugar, calories, or salt (sodium) in your diet.  Drinking too much alcohol. Some risk factors for high blood pressure may be difficult or impossible to change. Some of these factors include:  Having chronic kidney disease.  Having a family history of high blood pressure.  Age. Risk increases with age.  Race. You may be at higher risk if you are African American.  Gender. Men are at higher risk than women before age 45. After age 65, women are at higher risk than men.  Having obstructive sleep apnea.  Stress. What are the signs or symptoms? High blood pressure may not cause symptoms. Very high blood pressure (hypertensive crisis) may cause:  Headache.  Anxiety.  Shortness of breath.  Nosebleed.  Nausea and vomiting.  Vision changes.  Severe chest pain.  Seizures. How is this diagnosed? This condition is diagnosed by measuring your blood pressure while you are seated, with your arm resting on a flat surface, your legs uncrossed, and your feet flat on the floor. The cuff of the blood pressure monitor will be placed directly against the skin of your upper arm at the level of your   heart. It should be measured at least twice using the same arm. Certain conditions can cause a difference in blood pressure between your right and left arms. Certain factors can cause blood pressure readings to be lower or higher than normal for a short period of time:  When your blood pressure is higher when you are in a health care provider's office than when you are at home, this is called white coat hypertension.  Most people with this condition do not need medicines.  When your blood pressure is higher at home than when you are in a health care provider's office, this is called masked hypertension. Most people with this condition may need medicines to control blood pressure. If you have a high blood pressure reading during one visit or you have normal blood pressure with other risk factors, you may be asked to:  Return on a different day to have your blood pressure checked again.  Monitor your blood pressure at home for 1 week or longer. If you are diagnosed with hypertension, you may have other blood or imaging tests to help your health care provider understand your overall risk for other conditions. How is this treated? This condition is treated by making healthy lifestyle changes, such as eating healthy foods, exercising more, and reducing your alcohol intake. Your health care provider may prescribe medicine if lifestyle changes are not enough to get your blood pressure under control, and if:  Your systolic blood pressure is above 130.  Your diastolic blood pressure is above 80. Your personal target blood pressure may vary depending on your medical conditions, your age, and other factors. Follow these instructions at home: Eating and drinking   Eat a diet that is high in fiber and potassium, and low in sodium, added sugar, and fat. An example eating plan is called the DASH (Dietary Approaches to Stop Hypertension) diet. To eat this way: ? Eat plenty of fresh fruits and vegetables. Try to fill one half of your plate at each meal with fruits and vegetables. ? Eat whole grains, such as whole-wheat pasta, brown rice, or whole-grain bread. Fill about one fourth of your plate with whole grains. ? Eat or drink low-fat dairy products, such as skim milk or low-fat yogurt. ? Avoid fatty cuts of meat, processed or cured meats, and poultry with skin. Fill about one fourth of your plate with lean proteins, such  as fish, chicken without skin, beans, eggs, or tofu. ? Avoid pre-made and processed foods. These tend to be higher in sodium, added sugar, and fat.  Reduce your daily sodium intake. Most people with hypertension should eat less than 1,500 mg of sodium a day.  Do not drink alcohol if: ? Your health care provider tells you not to drink. ? You are pregnant, may be pregnant, or are planning to become pregnant.  If you drink alcohol: ? Limit how much you use to:  0-1 drink a day for women.  0-2 drinks a day for men. ? Be aware of how much alcohol is in your drink. In the [place] one drink equals one 12 oz bottle of beer (355 mL), one 5 oz glass of wine (148 mL), or one 1 oz glass of hard liquor (44 mL). Lifestyle   Work with your health care provider to maintain a healthy body weight or to lose weight. Ask what an ideal weight is for you.  Get at least 30 minutes of exercise most days of the week. Activities may include walking, swimming,   or biking.  Include exercise to strengthen your muscles (resistance exercise), such as Pilates or lifting weights, as part of your weekly exercise routine. Try to do these types of exercises for 30 minutes at least 3 days a week.  Do not use any products that contain nicotine or tobacco, such as cigarettes, e-cigarettes, and chewing tobacco. If you need help quitting, ask your health care provider.  Monitor your blood pressure at home as told by your health care provider.  Keep all follow-up visits as told by your health care provider. This is important. Medicines  Take over-the-counter and prescription medicines only as told by your health care provider. Follow directions carefully. Blood pressure medicines must be taken as prescribed.  Do not skip doses of blood pressure medicine. Doing this puts you at risk for problems and can make the medicine less effective.  Ask your health care provider about side effects or reactions to medicines that you  should watch for. Contact a health care provider if you:  Think you are having a reaction to a medicine you are taking.  Have headaches that keep coming back (recurring).  Feel dizzy.  Have swelling in your ankles.  Have trouble with your vision. Get help right away if you:  Develop a severe headache or confusion.  Have unusual weakness or numbness.  Feel faint.  Have severe pain in your chest or abdomen.  Vomit repeatedly.  Have trouble breathing. Summary  Hypertension is when the force of blood pumping through your arteries is too strong. If this condition is not controlled, it may put you at risk for serious complications.  Your personal target blood pressure may vary depending on your medical conditions, your age, and other factors. For most people, a normal blood pressure is less than 120/80.  Hypertension is treated with lifestyle changes, medicines, or a combination of both. Lifestyle changes include losing weight, eating a healthy, low-sodium diet, exercising more, and limiting alcohol. This information is not intended to replace advice given to you by your health care provider. Make sure you discuss any questions you have with your health care provider. Document Revised: 03/30/2018 Document Reviewed: 03/30/2018 Elsevier Patient Education  2020 Elsevier Inc.  

## 2020-01-23 LAB — LIPID PANEL
Chol/HDL Ratio: 3.3 ratio (ref 0.0–5.0)
Cholesterol, Total: 222 mg/dL — ABNORMAL HIGH (ref 100–199)
HDL: 67 mg/dL (ref >39)
LDL Chol Calc (NIH): 127 mg/dL — ABNORMAL HIGH (ref 0–99)
Triglycerides: 161 mg/dL — ABNORMAL HIGH (ref 0–149)
VLDL Cholesterol Cal: 28 mg/dL (ref 5–40)

## 2020-01-23 LAB — COMPREHENSIVE METABOLIC PANEL
ALT: 18 IU/L (ref 0–44)
AST: 16 IU/L (ref 0–40)
Albumin/Globulin Ratio: 1.6 (ref 1.2–2.2)
Albumin: 4.1 g/dL (ref 3.8–4.8)
Alkaline Phosphatase: 81 IU/L (ref 48–121)
BUN/Creatinine Ratio: 12 (ref 10–24)
BUN: 16 mg/dL (ref 8–27)
Bilirubin Total: 0.3 mg/dL (ref 0.0–1.2)
CO2: 22 mmol/L (ref 20–29)
Calcium: 8.9 mg/dL (ref 8.6–10.2)
Chloride: 106 mmol/L (ref 96–106)
Creatinine, Ser: 1.35 mg/dL — ABNORMAL HIGH (ref 0.76–1.27)
GFR calc Af Amer: 61 mL/min/{1.73_m2} (ref >59)
GFR calc non Af Amer: 53 mL/min/{1.73_m2} — ABNORMAL LOW (ref >59)
Globulin, Total: 2.5 g/dL (ref 1.5–4.5)
Glucose: 99 mg/dL (ref 65–99)
Potassium: 4 mmol/L (ref 3.5–5.2)
Sodium: 144 mmol/L (ref 134–144)
Total Protein: 6.6 g/dL (ref 6.0–8.5)

## 2020-01-23 LAB — CBC WITH DIFFERENTIAL/PLATELET
Basophils Absolute: 0 10*3/uL (ref 0.0–0.2)
Basos: 1 %
EOS (ABSOLUTE): 0.1 10*3/uL (ref 0.0–0.4)
Eos: 3 %
Hematocrit: 43 % (ref 37.5–51.0)
Hemoglobin: 14.5 g/dL (ref 13.0–17.7)
Immature Grans (Abs): 0 10*3/uL (ref 0.0–0.1)
Immature Granulocytes: 0 %
Lymphocytes Absolute: 2 10*3/uL (ref 0.7–3.1)
Lymphs: 46 %
MCH: 31 pg (ref 26.6–33.0)
MCHC: 33.7 g/dL (ref 31.5–35.7)
MCV: 92 fL (ref 79–97)
Monocytes Absolute: 0.4 10*3/uL (ref 0.1–0.9)
Monocytes: 10 %
Neutrophils Absolute: 1.7 10*3/uL (ref 1.4–7.0)
Neutrophils: 40 %
Platelets: 203 10*3/uL (ref 150–450)
RBC: 4.67 x10E6/uL (ref 4.14–5.80)
RDW: 13.3 % (ref 11.6–15.4)
WBC: 4.3 10*3/uL (ref 3.4–10.8)

## 2020-01-23 LAB — HEMOGLOBIN A1C
Est. average glucose Bld gHb Est-mCnc: 117 mg/dL
Hgb A1c MFr Bld: 5.7 % — ABNORMAL HIGH (ref 4.8–5.6)

## 2020-02-01 ENCOUNTER — Other Ambulatory Visit: Payer: Self-pay | Admitting: Emergency Medicine

## 2020-02-20 ENCOUNTER — Ambulatory Visit: Payer: Medicare HMO | Admitting: Podiatry

## 2020-02-20 ENCOUNTER — Encounter: Payer: Self-pay | Admitting: Podiatry

## 2020-02-20 ENCOUNTER — Other Ambulatory Visit: Payer: Self-pay

## 2020-02-20 ENCOUNTER — Ambulatory Visit (INDEPENDENT_AMBULATORY_CARE_PROVIDER_SITE_OTHER): Payer: Medicare HMO

## 2020-02-20 DIAGNOSIS — M1A0710 Idiopathic chronic gout, right ankle and foot, without tophus (tophi): Secondary | ICD-10-CM

## 2020-02-20 DIAGNOSIS — M7751 Other enthesopathy of right foot: Secondary | ICD-10-CM

## 2020-02-20 MED ORDER — COLCHICINE 0.6 MG PO CAPS: 1.0000 | ORAL_CAPSULE | Freq: Every day | ORAL | 2 refills | Status: DC

## 2020-02-21 NOTE — Progress Notes (Signed)
Currently Gerald Fisher presents today with a chief complaint of pain to the lateral ankle right.  States that he was doing good for a long time until he ran out of his colchicine.  He saw his primary doctor who provided him with another prescription and is here today because of lateral pain.  He states that the pain he is currently feeling does not mimic the pain that he has with his gout attacks.  He states is a different kind of a different kind of soreness.  Objective: Vital signs are stable he is alert and oriented x3.  Pulses are palpable.  He has pain on palpation of the right ankle laterally minimal pain medially.  He has no crepitation on range of motion of the ankle and he has no pain on palpation of the more proximal leg.  He has no calf pain.  Radiographs demonstrate osseously mature individual early osteoarthritic changes particular along the medial ankle and some along the lateral ankle but no significant mortise change.  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  Plan: Discussed etiology pathology conservative surgical therapies at this point I went ahead and injected his ankle with 10 mg Kenalog 5 mg Marcaine for maximal tenderness.  He tolerated seizure well without complications.  We refilled his colchicine and I will go ahead make him appointment for about 6 months or so.

## 2020-03-02 ENCOUNTER — Other Ambulatory Visit: Payer: Self-pay | Admitting: Emergency Medicine

## 2020-03-02 DIAGNOSIS — Z8739 Personal history of other diseases of the musculoskeletal system and connective tissue: Secondary | ICD-10-CM

## 2020-05-09 ENCOUNTER — Telehealth: Payer: Self-pay | Admitting: *Deleted

## 2020-05-09 NOTE — Telephone Encounter (Signed)
SCHEDULE AWV 

## 2020-06-10 ENCOUNTER — Ambulatory Visit: Payer: Medicare HMO | Attending: Internal Medicine

## 2020-06-10 DIAGNOSIS — Z23 Encounter for immunization: Secondary | ICD-10-CM

## 2020-06-10 NOTE — Progress Notes (Signed)
   Covid-19 Vaccination Clinic  Name:  Gerald Fisher    MRN: [id number] DOB: 1951/05/31  06/10/2020  Mr. Gerald Fisher was observed post Covid-19 immunization for 15 minutes without incident. He was provided with Vaccine Information Sheet and instruction to access the V-Safe system.   Gerald Fisher was instructed to call 911 with any severe reactions post vaccine: Marland Kitchen Difficulty breathing  . Swelling of face and throat  . A fast heartbeat  . A bad rash all over body  . Dizziness and weakness

## 2020-07-07 ENCOUNTER — Other Ambulatory Visit: Payer: Self-pay | Admitting: Emergency Medicine

## 2020-07-07 DIAGNOSIS — E785 Hyperlipidemia, unspecified: Secondary | ICD-10-CM

## 2020-07-07 NOTE — Telephone Encounter (Signed)
Requested Prescriptions  Pending Prescriptions Disp Refills  . MITIGARE 0.6 MG CAPS [Pharmacy Med Name: MITIGARE 0.6 MG CAPSULE] 90 capsule     Sig: TAKE 1 CAPSULE BY MOUTH EVERY DAY     There is no refill protocol information for this order    . rosuvastatin (CRESTOR) 20 MG tablet [Pharmacy Med Name: ROSUVASTATIN CALCIUM 20 MG TAB] 90 tablet 3    Sig: TAKE 1 TABLET BY MOUTH EVERY DAY     There is no refill protocol information for this order

## 2020-07-07 NOTE — Telephone Encounter (Signed)
Requested medication (s) are due for refill today: yes  Requested medication (s) are on the active medication list: yes  Last refill:  02/07/19  Future visit scheduled: yes  Notes to clinic:  no refill protocol for this med   Requested Prescriptions  Pending Prescriptions Disp Refills   rosuvastatin (CRESTOR) 20 MG tablet [Pharmacy Med Name: ROSUVASTATIN CALCIUM 20 MG TAB] 90 tablet 3    Sig: TAKE 1 TABLET BY MOUTH EVERY DAY      There is no refill protocol information for this order     Refused Prescriptions Disp Refills   MITIGARE 0.6 MG CAPS [Pharmacy Med Name: MITIGARE 0.6 MG CAPSULE] 90 capsule     Sig: TAKE 1 CAPSULE BY MOUTH EVERY DAY      There is no refill protocol information for this order

## 2020-07-08 NOTE — Telephone Encounter (Signed)
Patient request refill on medications requested previously all out. Please advise

## 2020-07-09 ENCOUNTER — Other Ambulatory Visit: Payer: Self-pay

## 2020-07-09 ENCOUNTER — Encounter: Payer: Self-pay | Admitting: Emergency Medicine

## 2020-07-09 ENCOUNTER — Ambulatory Visit (INDEPENDENT_AMBULATORY_CARE_PROVIDER_SITE_OTHER): Payer: Medicare HMO | Admitting: Emergency Medicine

## 2020-07-09 VITALS — BP 138/80 | HR 74 | Temp 98.1°F | Resp 16 | Ht 70.0 in | Wt 198.0 lb

## 2020-07-09 DIAGNOSIS — Z8739 Personal history of other diseases of the musculoskeletal system and connective tissue: Secondary | ICD-10-CM | POA: Diagnosis not present

## 2020-07-09 DIAGNOSIS — E785 Hyperlipidemia, unspecified: Secondary | ICD-10-CM | POA: Diagnosis not present

## 2020-07-09 DIAGNOSIS — Z23 Encounter for immunization: Secondary | ICD-10-CM | POA: Diagnosis not present

## 2020-07-09 DIAGNOSIS — M76899 Other specified enthesopathies of unspecified lower limb, excluding foot: Secondary | ICD-10-CM | POA: Diagnosis not present

## 2020-07-09 DIAGNOSIS — I10 Essential (primary) hypertension: Secondary | ICD-10-CM | POA: Diagnosis not present

## 2020-07-09 LAB — CBC WITH DIFFERENTIAL/PLATELET
Basophils Absolute: 0 10*3/uL (ref 0.0–0.2)
Basos: 1 %
EOS (ABSOLUTE): 0.1 10*3/uL (ref 0.0–0.4)
Eos: 1 %
Hematocrit: 46.8 % (ref 37.5–51.0)
Hemoglobin: 16.3 g/dL (ref 13.0–17.7)
Immature Grans (Abs): 0 10*3/uL (ref 0.0–0.1)
Immature Granulocytes: 0 %
Lymphocytes Absolute: 2.1 10*3/uL (ref 0.7–3.1)
Lymphs: 35 %
MCH: 32.5 pg (ref 26.6–33.0)
MCHC: 34.8 g/dL (ref 31.5–35.7)
MCV: 93 fL (ref 79–97)
Monocytes Absolute: 0.6 10*3/uL (ref 0.1–0.9)
Monocytes: 11 %
Neutrophils Absolute: 3 10*3/uL (ref 1.4–7.0)
Neutrophils: 52 %
Platelets: 200 10*3/uL (ref 150–450)
RBC: 5.02 x10E6/uL (ref 4.14–5.80)
RDW: 11.8 % (ref 11.6–15.4)
WBC: 5.8 10*3/uL (ref 3.4–10.8)

## 2020-07-09 LAB — LIPID PANEL
Chol/HDL Ratio: 4.3 ratio (ref 0.0–5.0)
Cholesterol, Total: 288 mg/dL — ABNORMAL HIGH (ref 100–199)
HDL: 67 mg/dL (ref >39)
LDL Chol Calc (NIH): 192 mg/dL — ABNORMAL HIGH (ref 0–99)
Triglycerides: 159 mg/dL — ABNORMAL HIGH (ref 0–149)
VLDL Cholesterol Cal: 29 mg/dL (ref 5–40)

## 2020-07-09 LAB — COMPREHENSIVE METABOLIC PANEL
ALT: 11 IU/L (ref 0–44)
AST: 10 IU/L (ref 0–40)
Albumin/Globulin Ratio: 1.8 (ref 1.2–2.2)
Albumin: 4.5 g/dL (ref 3.8–4.8)
Alkaline Phosphatase: 86 IU/L (ref 44–121)
BUN/Creatinine Ratio: 11 (ref 10–24)
BUN: 15 mg/dL (ref 8–27)
Bilirubin Total: 0.6 mg/dL (ref 0.0–1.2)
CO2: 26 mmol/L (ref 20–29)
Calcium: 9.7 mg/dL (ref 8.6–10.2)
Chloride: 101 mmol/L (ref 96–106)
Creatinine, Ser: 1.31 mg/dL — ABNORMAL HIGH (ref 0.76–1.27)
GFR calc Af Amer: 63 mL/min/{1.73_m2} (ref >59)
GFR calc non Af Amer: 55 mL/min/{1.73_m2} — ABNORMAL LOW (ref >59)
Globulin, Total: 2.5 g/dL (ref 1.5–4.5)
Glucose: 97 mg/dL (ref 65–99)
Potassium: 4.4 mmol/L (ref 3.5–5.2)
Sodium: 139 mmol/L (ref 134–144)
Total Protein: 7 g/dL (ref 6.0–8.5)

## 2020-07-09 LAB — HEMOGLOBIN A1C
Est. average glucose Bld gHb Est-mCnc: 117 mg/dL
Hgb A1c MFr Bld: 5.7 % — ABNORMAL HIGH (ref 4.8–5.6)

## 2020-07-09 MED ORDER — MELOXICAM 15 MG PO TABS: 15.0000 mg | ORAL_TABLET | Freq: Every day | ORAL | 0 refills | Status: AC

## 2020-07-09 MED ORDER — COLCHICINE 0.6 MG PO CAPS: 1.0000 | ORAL_CAPSULE | Freq: Every day | ORAL | 11 refills | Status: DC

## 2020-07-09 NOTE — Progress Notes (Signed)
Gerald Fisher 69 y.o.   Chief Complaint  Patient presents with  . Knee Pain    Right - per patient started last Friday after ranking leaves    HISTORY OF PRESENT ILLNESS: This is a 69 y.o. male complaining of pain to right knee area since last Friday. No other complaints or medical concerns. Constant localized sharp pain worse with movement better with rest.  No associated symptoms. Has history of dyslipidemia and hypertension.  Compliant with medications. Also has a history of gout.  Takes daily colchicine with good results. Fully vaccinated against Covid with a booster.  HPI   Prior to Admission medications   Medication Sig Start Date End Date Taking? Authorizing Provider  aspirin EC 81 MG tablet Take 81 mg by mouth daily.   Yes [provider]  Colchicine (MITIGARE) 0.6 MG CAPS Take 1 capsule by mouth daily. 02/20/20  Yes Hyatt, Max T, DPM  lisinopril (ZESTRIL) 40 MG tablet Take 1 tablet (40 mg total) by mouth daily. 01/22/20  Yes Horald Pollen, MD  rosuvastatin ([place]) 20 MG tablet TAKE 1 TABLET BY MOUTH EVERY DAY 07/08/20  Yes Maikayla Beggs, Ines Bloomer, MD    No Known Allergies  Patient Active Problem List   Diagnosis Date Noted  . Prediabetes 08/29/2018  . History of gout 08/29/2018  . Essential hypertension 05/24/2015    Past Medical History:  Diagnosis Date  . Hypertension     Past Surgical History:  Procedure Laterality Date  . FOOT SURGERY      Social History   Socioeconomic History  . Marital status: Single    Spouse name: Not on file  . Number of children: 2  . Years of education: Not on file  . Highest education level: Not on file  Occupational History  . Not on file  Tobacco Use  . Smoking status: Never Smoker  . Smokeless tobacco: Never Used  Vaping Use  . Vaping Use: Never used  Substance and Sexual Activity  . Alcohol use: Yes  . Drug use: No  . Sexual activity: Yes    Birth control/protection: Condom  Other Topics  Concern  . Not on file  Social History Narrative  . Not on file   Social Determinants of Health   Financial Resource Strain:   . Difficulty of Paying Living Expenses: Not on file  Food Insecurity:   . Worried About Charity fundraiser in the Last Year: Not on file  . Ran Out of Food in the Last Year: Not on file  Transportation Needs:   . Lack of Transportation (Medical): Not on file  . Lack of Transportation (Non-Medical): Not on file  Physical Activity:   . Days of Exercise per Week: Not on file  . Minutes of Exercise per Session: Not on file  Stress:   . Feeling of Stress : Not on file  Social Connections:   . Frequency of Communication with Friends and Family: Not on file  . Frequency of Social Gatherings with Friends and Family: Not on file  . Attends Religious Services: Not on file  . Active Member of Clubs or Organizations: Not on file  . Attends Archivist Meetings: Not on file  . Marital Status: Not on file  Intimate Partner Violence:   . Fear of Current or Ex-Partner: Not on file  . Emotionally Abused: Not on file  . Physically Abused: Not on file  . Sexually Abused: Not on file    Family History  Problem Relation Age of Onset  . Heart disease Mother   . Hypertension Father      Review of Systems  Constitutional: Negative.  Negative for chills and fever.  HENT: Negative.  Negative for congestion and sore throat.   Respiratory: Negative.  Negative for cough and shortness of breath.   Cardiovascular: Negative.  Negative for chest pain and palpitations.  Gastrointestinal: Negative.  Negative for abdominal pain, diarrhea, nausea and vomiting.  Genitourinary: Negative.  Negative for dysuria and hematuria.  Skin: Negative.  Negative for rash.  Neurological: Negative.  Negative for dizziness and headaches.  All other systems reviewed and are negative.    Today's Vitals   07/09/20 1102  BP: (!) 155/82  Pulse: 74  Resp: 16  Temp: 98.1 F (36.7 C)    TempSrc: Temporal  SpO2: 98%  Weight: 198 lb (89.8 kg)  Height: 5\' 10"  (1.778 m)   Body mass index is 28.41 kg/m.   Physical Exam Vitals reviewed.  Constitutional:      Appearance: Normal appearance.  HENT:     Head: Normocephalic.  Eyes:     Extraocular Movements: Extraocular movements intact.     Conjunctiva/sclera: Conjunctivae normal.     Pupils: Pupils are equal, round, and reactive to light.  Cardiovascular:     Rate and Rhythm: Normal rate and regular rhythm.     Pulses: Normal pulses.     Heart sounds: Normal heart sounds.  Pulmonary:     Effort: Pulmonary effort is normal.     Breath sounds: Normal breath sounds.  Abdominal:     General: Bowel sounds are normal. There is no distension.     Palpations: Abdomen is soft.  Musculoskeletal:     Cervical back: Normal range of motion and neck supple.     Comments: Right knee: Full range of motion.  Stable in flexion and extension.  No swelling or erythema. Positive tenderness to quadricep insertion tendon.  Skin:    General: Skin is warm and dry.     Capillary Refill: Capillary refill takes less than 2 seconds.  Neurological:     General: No focal deficit present.     Mental Status: He is alert and oriented to person, place, and time.  Psychiatric:        Mood and Affect: Mood normal.        Behavior: Behavior normal.    A total of 30 minutes was spent with the patient, greater than 50% of which was in counseling/coordination of care regarding differential diagnosis of right knee pain including tendinitis of quadriceps muscle, treatment with NSAIDs and analgesic, review of chronic medical problems including hypertension and dyslipidemia and cardiovascular risks associated with these conditions, review of all medications including colchicine for prevention of gout, review of most recent office visit notes, health maintenance items, education on nutrition, documentation, prognosis, and need for follow-up.   ASSESSMENT  & PLAN: Gerald Fisher was seen today for knee pain.  Diagnoses and all orders for this visit:  Tendinitis of quadriceps -     meloxicam (MOBIC) 15 MG tablet; Take 1 tablet (15 mg total) by mouth daily for 5 days. Then take it as needed.  Need for prophylactic vaccination and inoculation against influenza -     Flu Vaccine QUAD High Dose(Fluad)  History of gout -     Colchicine (MITIGARE) 0.6 MG CAPS; Take 1 capsule by mouth daily.  Essential hypertension -     Comprehensive metabolic panel -  CBC with Differential/Platelet -     Hemoglobin A1c  Dyslipidemia -     CBC with Differential/Platelet -     Hemoglobin A1c -     Lipid panel    Patient Instructions       If you have lab work done today you will be contacted with your lab results within the next 2 weeks.  If you have not heard from [place] then please contact us. The fastest way to get your results is to register for My Chart.   IF you received an x-ray today, you will receive an invoice from [facility]. Please contact [facility] at [phone] with questions or concerns regarding your invoice.   IF you received labwork today, you will receive an invoice from [facility]. Please contact [facility] at [phone] with questions or concerns regarding your invoice.   Our billing staff will not be able to assist you with questions regarding bills from these companies.  You will be contacted with the lab results as soon as they are available. The fastest way to get your results is to activate your My Chart account. Instructions are located on the last page of this paperwork. If you have not heard from [place] regarding the results in 2 weeks, please contact this office.     Tendinitis  Tendinitis is swelling (inflammation) of a tendon. A tendon is a cord of tissue that connects muscle to bone. Tendinitis can cause pain, tenderness, and swelling. What are the causes?  Using a tendon or muscle too much (overuse).  This is a common cause.  Wear and tear that happens as you age.  Injury.  Some medical conditions, such as arthritis.  Some medicines. What increases the risk? You are more likely to get this condition if you do activities that involve the same movements over and over again (repetitive motions). What are the signs or symptoms?  Pain.  Tenderness.  Mild swelling.  Decreased range of motion. How is this treated? This condition is usually treated with RICE therapy. RICE stands for:  Rest.  Ice.  Compression. This means putting pressure on the affected area.  Elevation. This means raising the affected area above the level of your heart. Treatment may also include:  Medicines for swelling or pain.  Exercises or physical therapy.  A brace or splint.  Surgery. This is rarely needed. Follow these instructions at home: If you have a splint or brace:  Wear the splint or brace as told by your doctor. Remove it only as told by your doctor.  Loosen the splint or brace if your fingers or toes: ? Tingle. ? Become numb. ? Turn cold and blue.  Keep the splint or brace clean.  If the splint or brace is not waterproof: ? Do not let it get wet. ? Cover it with a watertight covering when you take a bath or shower. Managing pain, stiffness, and swelling      If told, put ice on the affected area. ? If you have a removable splint or brace, remove it as told by your doctor. ? Put ice in a plastic bag. ? Place a towel between your skin and the bag. ? Leave the ice on for 20 minutes, 2-3 times a day.  Move the fingers or toes of the affected arm or leg often, if this applies. This helps to prevent stiffness and to lessen swelling.  If told, raise the affected area above the level of your heart while you are sitting  or lying down.  If told, put heat on the affected area before you exercise. Use the heat source that your doctor recommends, such as a moist heat pack or a  heating pad. ? Place a towel between your skin and the heat source. ? Leave the heat on for 20-30 minutes. ? Remove the heat if your skin turns bright red. This is very important if you are unable to feel pain, heat, or cold. You may have a greater risk of getting burned. Driving  Do not drive or use heavy machinery while taking prescription pain medicine.  Ask your doctor when it is safe to drive if you have a splint or brace on any part of your arm or leg. Activity  Rest the affected area as told by your doctor.  Return to your normal activities as told by your doctor. Ask your doctor what activities are safe for you.  Avoid using the affected area while you have symptoms.  Do exercises as told by your doctor. General instructions  If you have a splint, do not put pressure on any part of the splint until it is fully hardened. This may take several hours.  Wear an elastic bandage or pressure (compression) wrap only as told by your doctor.  Take over-the-counter and prescription medicines only as told by your doctor.  Keep all follow-up visits as told by your doctor. This is important. Contact a doctor if:  You do not get better.  You get new problems, such as numbness in your hands, and you do not know why. Summary  Tendinitis is swelling (inflammation) of a tendon.  You are more likely to get this condition if you do activities that involve the same movements over and over again.  This condition is usually treated with RICE therapy. RICE stands for rest, ice, compression, and elevate.  Avoid using the affected area while you have symptoms. This information is not intended to replace advice given to you by your health care provider. Make sure you discuss any questions you have with your health care provider. Document Revised: 01/25/2018 Document Reviewed: 12/08/2017 Elsevier Patient Education  2020 Elsevier Inc.      [name], MD [facility]

## 2020-07-09 NOTE — Patient Instructions (Addendum)
If you have lab work done today you will be contacted with your lab results within the next 2 weeks.  If you have not heard from [place] then please contact us. The fastest way to get your results is to register for My Chart.   IF you received an x-ray today, you will receive an invoice from [facility]. Please contact [facility] at [phone] with questions or concerns regarding your invoice.   IF you received labwork today, you will receive an invoice from [facility]. Please contact [facility] at [phone] with questions or concerns regarding your invoice.   Our billing staff will not be able to assist you with questions regarding bills from these companies.  You will be contacted with the lab results as soon as they are available. The fastest way to get your results is to activate your My Chart account. Instructions are located on the last page of this paperwork. If you have not heard from [place] regarding the results in 2 weeks, please contact this office.     Tendinitis  Tendinitis is swelling (inflammation) of a tendon. A tendon is a cord of tissue that connects muscle to bone. Tendinitis can cause pain, tenderness, and swelling. What are the causes?  Using a tendon or muscle too much (overuse). This is a common cause.  Wear and tear that happens as you age.  Injury.  Some medical conditions, such as arthritis.  Some medicines. What increases the risk? You are more likely to get this condition if you do activities that involve the same movements over and over again (repetitive motions). What are the signs or symptoms?  Pain.  Tenderness.  Mild swelling.  Decreased range of motion. How is this treated? This condition is usually treated with RICE therapy. RICE stands for:  Rest.  Ice.  Compression. This means putting pressure on the affected area.  Elevation. This means raising the affected area above the level of your heart. Treatment may also  include:  Medicines for swelling or pain.  Exercises or physical therapy.  A brace or splint.  Surgery. This is rarely needed. Follow these instructions at home: If you have a splint or brace:  Wear the splint or brace as told by your doctor. Remove it only as told by your doctor.  Loosen the splint or brace if your fingers or toes: ? Tingle. ? Become numb. ? Turn cold and blue.  Keep the splint or brace clean.  If the splint or brace is not waterproof: ? Do not let it get wet. ? Cover it with a watertight covering when you take a bath or shower. Managing pain, stiffness, and swelling      If told, put ice on the affected area. ? If you have a removable splint or brace, remove it as told by your doctor. ? Put ice in a plastic bag. ? Place a towel between your skin and the bag. ? Leave the ice on for 20 minutes, 2-3 times a day.  Move the fingers or toes of the affected arm or leg often, if this applies. This helps to prevent stiffness and to lessen swelling.  If told, raise the affected area above the level of your heart while you are sitting or lying down.  If told, put heat on the affected area before you exercise. Use the heat source that your doctor recommends, such as a moist heat pack or a heating pad. ? Place a towel between your skin and the  heat source. ? Leave the heat on for 20-30 minutes. ? Remove the heat if your skin turns bright red. This is very important if you are unable to feel pain, heat, or cold. You may have a greater risk of getting burned. Driving  Do not drive or use heavy machinery while taking prescription pain medicine.  Ask your doctor when it is safe to drive if you have a splint or brace on any part of your arm or leg. Activity  Rest the affected area as told by your doctor.  Return to your normal activities as told by your doctor. Ask your doctor what activities are safe for you.  Avoid using the affected area while you have  symptoms.  Do exercises as told by your doctor. General instructions  If you have a splint, do not put pressure on any part of the splint until it is fully hardened. This may take several hours.  Wear an elastic bandage or pressure (compression) wrap only as told by your doctor.  Take over-the-counter and prescription medicines only as told by your doctor.  Keep all follow-up visits as told by your doctor. This is important. Contact a doctor if:  You do not get better.  You get new problems, such as numbness in your hands, and you do not know why. Summary  Tendinitis is swelling (inflammation) of a tendon.  You are more likely to get this condition if you do activities that involve the same movements over and over again.  This condition is usually treated with RICE therapy. RICE stands for rest, ice, compression, and elevate.  Avoid using the affected area while you have symptoms. This information is not intended to replace advice given to you by your health care provider. Make sure you discuss any questions you have with your health care provider. Document Revised: 01/25/2018 Document Reviewed: 12/08/2017 Elsevier Patient Education  [employer].

## 2020-07-22 ENCOUNTER — Ambulatory Visit (INDEPENDENT_AMBULATORY_CARE_PROVIDER_SITE_OTHER): Payer: Medicare HMO | Admitting: Emergency Medicine

## 2020-07-22 ENCOUNTER — Encounter: Payer: Self-pay | Admitting: Emergency Medicine

## 2020-07-22 ENCOUNTER — Other Ambulatory Visit: Payer: Self-pay

## 2020-07-22 VITALS — BP 170/82 | HR 57 | Temp 97.7°F | Resp 16 | Ht 70.0 in | Wt 198.0 lb

## 2020-07-22 DIAGNOSIS — E785 Hyperlipidemia, unspecified: Secondary | ICD-10-CM | POA: Diagnosis not present

## 2020-07-22 DIAGNOSIS — I10 Essential (primary) hypertension: Secondary | ICD-10-CM | POA: Diagnosis not present

## 2020-07-22 NOTE — Patient Instructions (Addendum)
   If you have lab work done today you will be contacted with your lab results within the next 2 weeks.  If you have not heard from us then please contact us. The fastest way to get your results is to register for My Chart.   IF you received an x-ray today, you will receive an invoice from [facility]. Please contact [facility] at [phone] with questions or concerns regarding your invoice.   IF you received labwork today, you will receive an invoice from [facility]. Please contact [facility] at [phone] with questions or concerns regarding your invoice.   Our billing staff will not be able to assist you with questions regarding bills from these companies.  You will be contacted with the lab results as soon as they are available. The fastest way to get your results is to activate your My Chart account. Instructions are located on the last page of this paperwork. If you have not heard from us regarding the results in 2 weeks, please contact this office.      Hypertension, Adult High blood pressure (hypertension) is when the force of blood pumping through the arteries is too strong. The arteries are the blood vessels that carry blood from the heart throughout the body. Hypertension forces the heart to work harder to pump blood and may cause arteries to become narrow or stiff. Untreated or uncontrolled hypertension can cause a heart attack, heart failure, a stroke, kidney disease, and other problems. A blood pressure reading consists of a higher number over a lower number. Ideally, your blood pressure should be below 120/80. The first ("top") number is called the systolic pressure. It is a measure of the pressure in your arteries as your heart beats. The second ("bottom") number is called the diastolic pressure. It is a measure of the pressure in your arteries as the heart relaxes. What are the causes? The exact cause of this condition is not known. There are some conditions  that result in or are related to high blood pressure. What increases the risk? Some risk factors for high blood pressure are under your control. The following factors may make you more likely to develop this condition:  Smoking.  Having type 2 diabetes mellitus, high cholesterol, or both.  Not getting enough exercise or physical activity.  Being overweight.  Having too much fat, sugar, calories, or salt (sodium) in your diet.  Drinking too much alcohol. Some risk factors for high blood pressure may be difficult or impossible to change. Some of these factors include:  Having chronic kidney disease.  Having a family history of high blood pressure.  Age. Risk increases with age.  Race. You may be at higher risk if you are African American.  Gender. Men are at higher risk than women before age 45. After age 65, women are at higher risk than men.  Having obstructive sleep apnea.  Stress. What are the signs or symptoms? High blood pressure may not cause symptoms. Very high blood pressure (hypertensive crisis) may cause:  Headache.  Anxiety.  Shortness of breath.  Nosebleed.  Nausea and vomiting.  Vision changes.  Severe chest pain.  Seizures. How is this diagnosed? This condition is diagnosed by measuring your blood pressure while you are seated, with your arm resting on a flat surface, your legs uncrossed, and your feet flat on the floor. The cuff of the blood pressure monitor will be placed directly against the skin of your upper arm at the level of your   heart. It should be measured at least twice using the same arm. Certain conditions can cause a difference in blood pressure between your right and left arms. Certain factors can cause blood pressure readings to be lower or higher than normal for a short period of time:  When your blood pressure is higher when you are in a health care provider's office than when you are at home, this is called white coat hypertension.  Most people with this condition do not need medicines.  When your blood pressure is higher at home than when you are in a health care provider's office, this is called masked hypertension. Most people with this condition may need medicines to control blood pressure. If you have a high blood pressure reading during one visit or you have normal blood pressure with other risk factors, you may be asked to:  Return on a different day to have your blood pressure checked again.  Monitor your blood pressure at home for 1 week or longer. If you are diagnosed with hypertension, you may have other blood or imaging tests to help your health care provider understand your overall risk for other conditions. How is this treated? This condition is treated by making healthy lifestyle changes, such as eating healthy foods, exercising more, and reducing your alcohol intake. Your health care provider may prescribe medicine if lifestyle changes are not enough to get your blood pressure under control, and if:  Your systolic blood pressure is above 130.  Your diastolic blood pressure is above 80. Your personal target blood pressure may vary depending on your medical conditions, your age, and other factors. Follow these instructions at home: Eating and drinking   Eat a diet that is high in fiber and potassium, and low in sodium, added sugar, and fat. An example eating plan is called the DASH (Dietary Approaches to Stop Hypertension) diet. To eat this way: ? Eat plenty of fresh fruits and vegetables. Try to fill one half of your plate at each meal with fruits and vegetables. ? Eat whole grains, such as whole-wheat pasta, brown rice, or whole-grain bread. Fill about one fourth of your plate with whole grains. ? Eat or drink low-fat dairy products, such as skim milk or low-fat yogurt. ? Avoid fatty cuts of meat, processed or cured meats, and poultry with skin. Fill about one fourth of your plate with lean proteins, such  as fish, chicken without skin, beans, eggs, or tofu. ? Avoid pre-made and processed foods. These tend to be higher in sodium, added sugar, and fat.  Reduce your daily sodium intake. Most people with hypertension should eat less than 1,500 mg of sodium a day.  Do not drink alcohol if: ? Your health care provider tells you not to drink. ? You are pregnant, may be pregnant, or are planning to become pregnant.  If you drink alcohol: ? Limit how much you use to:  0-1 drink a day for women.  0-2 drinks a day for men. ? Be aware of how much alcohol is in your drink. In the [place] one drink equals one 12 oz bottle of beer (355 mL), one 5 oz glass of wine (148 mL), or one 1 oz glass of hard liquor (44 mL). Lifestyle   Work with your health care provider to maintain a healthy body weight or to lose weight. Ask what an ideal weight is for you.  Get at least 30 minutes of exercise most days of the week. Activities may include walking, swimming,   or biking.  Include exercise to strengthen your muscles (resistance exercise), such as Pilates or lifting weights, as part of your weekly exercise routine. Try to do these types of exercises for 30 minutes at least 3 days a week.  Do not use any products that contain nicotine or tobacco, such as cigarettes, e-cigarettes, and chewing tobacco. If you need help quitting, ask your health care provider.  Monitor your blood pressure at home as told by your health care provider.  Keep all follow-up visits as told by your health care provider. This is important. Medicines  Take over-the-counter and prescription medicines only as told by your health care provider. Follow directions carefully. Blood pressure medicines must be taken as prescribed.  Do not skip doses of blood pressure medicine. Doing this puts you at risk for problems and can make the medicine less effective.  Ask your health care provider about side effects or reactions to medicines that you  should watch for. Contact a health care provider if you:  Think you are having a reaction to a medicine you are taking.  Have headaches that keep coming back (recurring).  Feel dizzy.  Have swelling in your ankles.  Have trouble with your vision. Get help right away if you:  Develop a severe headache or confusion.  Have unusual weakness or numbness.  Feel faint.  Have severe pain in your chest or abdomen.  Vomit repeatedly.  Have trouble breathing. Summary  Hypertension is when the force of blood pumping through your arteries is too strong. If this condition is not controlled, it may put you at risk for serious complications.  Your personal target blood pressure may vary depending on your medical conditions, your age, and other factors. For most people, a normal blood pressure is less than 120/80.  Hypertension is treated with lifestyle changes, medicines, or a combination of both. Lifestyle changes include losing weight, eating a healthy, low-sodium diet, exercising more, and limiting alcohol. This information is not intended to replace advice given to you by your health care provider. Make sure you discuss any questions you have with your health care provider. Document Revised: 03/30/2018 Document Reviewed: 03/30/2018 Elsevier Patient Education  2020 [employer]  

## 2020-07-22 NOTE — Progress Notes (Signed)
Gerald Fisher 69 y.o.   Chief Complaint  Patient presents with  . Knee Pain    Follow up right knee per patient it is better    HISTORY OF PRESENT ILLNESS: This is a 69 y.o. male with history of hypertension here for follow-up. Also here for follow-up of right knee tendinitis.  Meloxicam helping.  A lot better. Has no complaints or medical concerns today.  HPI   Prior to Admission medications   Medication Sig Start Date End Date Taking? Authorizing Provider  aspirin EC 81 MG tablet Take 81 mg by mouth daily.   Yes [provider]  Colchicine (MITIGARE) 0.6 MG CAPS Take 1 capsule by mouth daily. 07/09/20  Yes Kimsey Demaree, Ines Bloomer, MD  lisinopril (ZESTRIL) 40 MG tablet Take 1 tablet (40 mg total) by mouth daily. 01/22/20  Yes Horald Pollen, MD  rosuvastatin ([place]) 20 MG tablet TAKE 1 TABLET BY MOUTH EVERY DAY 07/08/20  Yes Mialynn Shelvin, Ines Bloomer, MD    No Known Allergies  Patient Active Problem List   Diagnosis Date Noted  . Prediabetes 08/29/2018  . History of gout 08/29/2018  . Essential hypertension 05/24/2015    Past Medical History:  Diagnosis Date  . Hypertension     Past Surgical History:  Procedure Laterality Date  . FOOT SURGERY      Social History   Socioeconomic History  . Marital status: Single    Spouse name: Not on file  . Number of children: 2  . Years of education: Not on file  . Highest education level: Not on file  Occupational History  . Not on file  Tobacco Use  . Smoking status: Never Smoker  . Smokeless tobacco: Never Used  Vaping Use  . Vaping Use: Never used  Substance and Sexual Activity  . Alcohol use: Yes  . Drug use: No  . Sexual activity: Yes    Birth control/protection: Condom  Other Topics Concern  . Not on file  Social History Narrative  . Not on file   Social Determinants of Health   Financial Resource Strain: Not on file  Food Insecurity: Not on file  Transportation Needs: Not on file   Physical Activity: Not on file  Stress: Not on file  Social Connections: Not on file  Intimate Partner Violence: Not on file    Family History  Problem Relation Age of Onset  . Heart disease Mother   . Hypertension Father      Review of Systems  Constitutional: Negative.  Negative for chills and fever.  HENT: Negative.  Negative for congestion and sore throat.   Respiratory: Negative.  Negative for cough and shortness of breath.   Cardiovascular: Negative.  Negative for chest pain and palpitations.  Gastrointestinal: Negative.  Negative for abdominal pain, diarrhea, nausea and vomiting.  Genitourinary: Negative.  Negative for dysuria and hematuria.  Musculoskeletal: Negative.  Negative for back pain, myalgias and neck pain.  Skin: Negative.  Negative for rash.  Neurological: Negative.  Negative for dizziness and headaches.  All other systems reviewed and are negative.   Today's Vitals   07/22/20 0958  BP: (!) 170/82  Pulse: (!) 57  Resp: 16  Temp: 97.7 F (36.5 C)  TempSrc: Temporal  SpO2: 100%  Weight: 198 lb (89.8 kg)  Height: 5\' 10"  (1.778 m)   Body mass index is 28.41 kg/m.  Physical Exam Vitals reviewed.  Constitutional:      Appearance: Normal appearance.  HENT:  Head: Normocephalic.  Eyes:     Extraocular Movements: Extraocular movements intact.     Conjunctiva/sclera: Conjunctivae normal.     Pupils: Pupils are equal, round, and reactive to light.  Cardiovascular:     Rate and Rhythm: Normal rate and regular rhythm.     Pulses: Normal pulses.     Heart sounds: Normal heart sounds.  Pulmonary:     Effort: Pulmonary effort is normal.     Breath sounds: Normal breath sounds.  Musculoskeletal:        General: Normal range of motion.     Cervical back: Normal range of motion and neck supple.  Skin:    General: Skin is warm and dry.     Capillary Refill: Capillary refill takes less than 2 seconds.  Neurological:     General: No focal deficit  present.     Mental Status: He is alert and oriented to person, place, and time.  Psychiatric:        Mood and Affect: Mood normal.        Behavior: Behavior normal.      ASSESSMENT & PLAN: Clinically stable.  No medical concerns identified during this visit. Continue present medications.  No changes. Systolic blood pressure elevated in the office. Advised to monitor blood pressure readings at home and contact me if persistently elevated. Halvor was seen today for knee pain.  Diagnoses and all orders for this visit:  Essential hypertension  Dyslipidemia    Patient Instructions       If you have lab work done today you will be contacted with your lab results within the next 2 weeks.  If you have not heard from [place] then please contact us. The fastest way to get your results is to register for My Chart.   IF you received an x-ray today, you will receive an invoice from [facility]. Please contact [facility] at [phone] with questions or concerns regarding your invoice.   IF you received labwork today, you will receive an invoice from [facility]. Please contact [facility] at [phone] with questions or concerns regarding your invoice.   Our billing staff will not be able to assist you with questions regarding bills from these companies.  You will be contacted with the lab results as soon as they are available. The fastest way to get your results is to activate your My Chart account. Instructions are located on the last page of this paperwork. If you have not heard from [place] regarding the results in 2 weeks, please contact this office.     Hypertension, Adult High blood pressure (hypertension) is when the force of blood pumping through the arteries is too strong. The arteries are the blood vessels that carry blood from the heart throughout the body. Hypertension forces the heart to work harder to pump blood and may cause arteries to become narrow or stiff.  Untreated or uncontrolled hypertension can cause a heart attack, heart failure, a stroke, kidney disease, and other problems. A blood pressure reading consists of a higher number over a lower number. Ideally, your blood pressure should be below 120/80. The first ("top") number is called the systolic pressure. It is a measure of the pressure in your arteries as your heart beats. The second ("bottom") number is called the diastolic pressure. It is a measure of the pressure in your arteries as the heart relaxes. What are the causes? The exact cause of this condition is not known. There are some conditions that result in  or are related to high blood pressure. What increases the risk? Some risk factors for high blood pressure are under your control. The following factors may make you more likely to develop this condition:  Smoking.  Having type 2 diabetes mellitus, high cholesterol, or both.  Not getting enough exercise or physical activity.  Being overweight.  Having too much fat, sugar, calories, or salt (sodium) in your diet.  Drinking too much alcohol. Some risk factors for high blood pressure may be difficult or impossible to change. Some of these factors include:  Having chronic kidney disease.  Having a family history of high blood pressure.  Age. Risk increases with age.  Race. You may be at higher risk if you are African American.  Gender. Men are at higher risk than women before age 94. After age 70, women are at higher risk than men.  Having obstructive sleep apnea.  Stress. What are the signs or symptoms? High blood pressure may not cause symptoms. Very high blood pressure (hypertensive crisis) may cause:  Headache.  Anxiety.  Shortness of breath.  Nosebleed.  Nausea and vomiting.  Vision changes.  Severe chest pain.  Seizures. How is this diagnosed? This condition is diagnosed by measuring your blood pressure while you are seated, with your arm resting on a  flat surface, your legs uncrossed, and your feet flat on the floor. The cuff of the blood pressure monitor will be placed directly against the skin of your upper arm at the level of your heart. It should be measured at least twice using the same arm. Certain conditions can cause a difference in blood pressure between your right and left arms. Certain factors can cause blood pressure readings to be lower or higher than normal for a short period of time:  When your blood pressure is higher when you are in a health care provider's office than when you are at home, this is called white coat hypertension. Most people with this condition do not need medicines.  When your blood pressure is higher at home than when you are in a health care provider's office, this is called masked hypertension. Most people with this condition may need medicines to control blood pressure. If you have a high blood pressure reading during one visit or you have normal blood pressure with other risk factors, you may be asked to:  Return on a different day to have your blood pressure checked again.  Monitor your blood pressure at home for 1 week or longer. If you are diagnosed with hypertension, you may have other blood or imaging tests to help your health care provider understand your overall risk for other conditions. How is this treated? This condition is treated by making healthy lifestyle changes, such as eating healthy foods, exercising more, and reducing your alcohol intake. Your health care provider may prescribe medicine if lifestyle changes are not enough to get your blood pressure under control, and if:  Your systolic blood pressure is above 130.  Your diastolic blood pressure is above 80. Your personal target blood pressure may vary depending on your medical conditions, your age, and other factors. Follow these instructions at home: Eating and drinking   Eat a diet that is high in fiber and potassium, and low in  sodium, added sugar, and fat. An example eating plan is called the DASH (Dietary Approaches to Stop Hypertension) diet. To eat this way: ? Eat plenty of fresh fruits and vegetables. Try to fill one half of your plate  at each meal with fruits and vegetables. ? Eat whole grains, such as whole-wheat pasta, brown rice, or whole-grain bread. Fill about one fourth of your plate with whole grains. ? Eat or drink low-fat dairy products, such as skim milk or low-fat yogurt. ? Avoid fatty cuts of meat, processed or cured meats, and poultry with skin. Fill about one fourth of your plate with lean proteins, such as fish, chicken without skin, beans, eggs, or tofu. ? Avoid pre-made and processed foods. These tend to be higher in sodium, added sugar, and fat.  Reduce your daily sodium intake. Most people with hypertension should eat less than 1,500 mg of sodium a day.  Do not drink alcohol if: ? Your health care provider tells you not to drink. ? You are pregnant, may be pregnant, or are planning to become pregnant.  If you drink alcohol: ? Limit how much you use to:  0-1 drink a day for women.  0-2 drinks a day for men. ? Be aware of how much alcohol is in your drink. In the [place] one drink equals one 12 oz bottle of beer (355 mL), one 5 oz glass of wine (148 mL), or one 1 oz glass of hard liquor (44 mL). Lifestyle   Work with your health care provider to maintain a healthy body weight or to lose weight. Ask what an ideal weight is for you.  Get at least 30 minutes of exercise most days of the week. Activities may include walking, swimming, or biking.  Include exercise to strengthen your muscles (resistance exercise), such as Pilates or lifting weights, as part of your weekly exercise routine. Try to do these types of exercises for 30 minutes at least 3 days a week.  Do not use any products that contain nicotine or tobacco, such as cigarettes, e-cigarettes, and chewing tobacco. If you need help  quitting, ask your health care provider.  Monitor your blood pressure at home as told by your health care provider.  Keep all follow-up visits as told by your health care provider. This is important. Medicines  Take over-the-counter and prescription medicines only as told by your health care provider. Follow directions carefully. Blood pressure medicines must be taken as prescribed.  Do not skip doses of blood pressure medicine. Doing this puts you at risk for problems and can make the medicine less effective.  Ask your health care provider about side effects or reactions to medicines that you should watch for. Contact a health care provider if you:  Think you are having a reaction to a medicine you are taking.  Have headaches that keep coming back (recurring).  Feel dizzy.  Have swelling in your ankles.  Have trouble with your vision. Get help right away if you:  Develop a severe headache or confusion.  Have unusual weakness or numbness.  Feel faint.  Have severe pain in your chest or abdomen.  Vomit repeatedly.  Have trouble breathing. Summary  Hypertension is when the force of blood pumping through your arteries is too strong. If this condition is not controlled, it may put you at risk for serious complications.  Your personal target blood pressure may vary depending on your medical conditions, your age, and other factors. For most people, a normal blood pressure is less than 120/80.  Hypertension is treated with lifestyle changes, medicines, or a combination of both. Lifestyle changes include losing weight, eating a healthy, low-sodium diet, exercising more, and limiting alcohol. This information is not intended to replace  advice given to you by your health care provider. Make sure you discuss any questions you have with your health care provider. Document Revised: 03/30/2018 Document Reviewed: 03/30/2018 Elsevier Patient Education  2020 Elsevier  Inc.      [name], MD [facility]

## 2020-08-22 ENCOUNTER — Ambulatory Visit: Payer: Medicare HMO | Admitting: Podiatry

## 2020-10-09 DIAGNOSIS — R69 Illness, unspecified: Secondary | ICD-10-CM | POA: Diagnosis not present

## 2020-12-05 DIAGNOSIS — R69 Illness, unspecified: Secondary | ICD-10-CM | POA: Diagnosis not present

## 2021-01-11 ENCOUNTER — Other Ambulatory Visit: Payer: Self-pay | Admitting: Emergency Medicine

## 2021-01-11 DIAGNOSIS — I10 Essential (primary) hypertension: Secondary | ICD-10-CM

## 2021-01-20 ENCOUNTER — Ambulatory Visit: Payer: Medicare HMO | Admitting: Emergency Medicine

## 2021-01-20 ENCOUNTER — Ambulatory Visit: Payer: Self-pay | Admitting: Emergency Medicine

## 2021-01-20 DIAGNOSIS — Z0289 Encounter for other administrative examinations: Secondary | ICD-10-CM

## 2021-04-12 ENCOUNTER — Other Ambulatory Visit: Payer: Self-pay | Admitting: Emergency Medicine

## 2021-04-12 DIAGNOSIS — I10 Essential (primary) hypertension: Secondary | ICD-10-CM

## 2021-05-29 ENCOUNTER — Other Ambulatory Visit: Payer: Self-pay | Admitting: Emergency Medicine

## 2021-05-29 DIAGNOSIS — Z8739 Personal history of other diseases of the musculoskeletal system and connective tissue: Secondary | ICD-10-CM

## 2021-06-27 ENCOUNTER — Other Ambulatory Visit: Payer: Self-pay | Admitting: Emergency Medicine

## 2021-06-27 DIAGNOSIS — E785 Hyperlipidemia, unspecified: Secondary | ICD-10-CM

## 2021-07-10 ENCOUNTER — Other Ambulatory Visit: Payer: Self-pay | Admitting: Emergency Medicine

## 2021-07-10 DIAGNOSIS — I10 Essential (primary) hypertension: Secondary | ICD-10-CM

## 2021-10-09 ENCOUNTER — Other Ambulatory Visit: Payer: Self-pay | Admitting: Emergency Medicine

## 2021-10-09 DIAGNOSIS — I10 Essential (primary) hypertension: Secondary | ICD-10-CM

## 2022-01-14 ENCOUNTER — Other Ambulatory Visit: Payer: Self-pay | Admitting: Emergency Medicine

## 2022-01-14 DIAGNOSIS — I10 Essential (primary) hypertension: Secondary | ICD-10-CM

## 2022-01-30 ENCOUNTER — Other Ambulatory Visit: Payer: Self-pay | Admitting: Emergency Medicine

## 2022-01-30 DIAGNOSIS — I10 Essential (primary) hypertension: Secondary | ICD-10-CM

## 2022-04-24 ENCOUNTER — Other Ambulatory Visit: Payer: Self-pay | Admitting: Emergency Medicine

## 2022-04-24 DIAGNOSIS — I10 Essential (primary) hypertension: Secondary | ICD-10-CM

## 2022-07-03 ENCOUNTER — Other Ambulatory Visit: Payer: Self-pay | Admitting: Emergency Medicine

## 2022-07-03 DIAGNOSIS — Z8739 Personal history of other diseases of the musculoskeletal system and connective tissue: Secondary | ICD-10-CM

## 2022-07-09 ENCOUNTER — Other Ambulatory Visit: Payer: Self-pay | Admitting: Emergency Medicine

## 2022-07-09 DIAGNOSIS — Z8739 Personal history of other diseases of the musculoskeletal system and connective tissue: Secondary | ICD-10-CM

## 2022-08-03 ENCOUNTER — Other Ambulatory Visit: Payer: Self-pay | Admitting: Emergency Medicine

## 2022-08-03 DIAGNOSIS — I10 Essential (primary) hypertension: Secondary | ICD-10-CM

## 2022-12-01 ENCOUNTER — Other Ambulatory Visit: Payer: Self-pay | Admitting: Emergency Medicine

## 2022-12-01 DIAGNOSIS — I10 Essential (primary) hypertension: Secondary | ICD-10-CM

## 2022-12-28 DIAGNOSIS — M25421 Effusion, right elbow: Secondary | ICD-10-CM | POA: Diagnosis not present

## 2023-01-06 ENCOUNTER — Ambulatory Visit (INDEPENDENT_AMBULATORY_CARE_PROVIDER_SITE_OTHER): Payer: Medicare HMO | Admitting: Emergency Medicine

## 2023-01-06 ENCOUNTER — Encounter: Payer: Self-pay | Admitting: Emergency Medicine

## 2023-01-06 VITALS — BP 130/70 | HR 55 | Temp 98.1°F | Ht 70.0 in | Wt 194.2 lb

## 2023-01-06 DIAGNOSIS — R7303 Prediabetes: Secondary | ICD-10-CM | POA: Diagnosis not present

## 2023-01-06 DIAGNOSIS — E785 Hyperlipidemia, unspecified: Secondary | ICD-10-CM

## 2023-01-06 DIAGNOSIS — I10 Essential (primary) hypertension: Secondary | ICD-10-CM

## 2023-01-06 DIAGNOSIS — M7021 Olecranon bursitis, right elbow: Secondary | ICD-10-CM | POA: Diagnosis not present

## 2023-01-06 DIAGNOSIS — Z8739 Personal history of other diseases of the musculoskeletal system and connective tissue: Secondary | ICD-10-CM | POA: Diagnosis not present

## 2023-01-06 LAB — COMPREHENSIVE METABOLIC PANEL
ALT: 12 U/L (ref 0–53)
AST: 14 U/L (ref 0–37)
Albumin: 3.9 g/dL (ref 3.5–5.2)
Alkaline Phosphatase: 72 U/L (ref 39–117)
BUN: 16 mg/dL (ref 6–23)
CO2: 23 mEq/L (ref 19–32)
Calcium: 8.8 mg/dL (ref 8.4–10.5)
Chloride: 105 mEq/L (ref 96–112)
Creatinine, Ser: 1.44 mg/dL (ref 0.40–1.50)
GFR: 48.23 mL/min — ABNORMAL LOW (ref >60.00)
Glucose, Bld: 95 mg/dL (ref 70–99)
Potassium: 4.1 mEq/L (ref 3.5–5.1)
Sodium: 142 mEq/L (ref 135–145)
Total Bilirubin: 0.7 mg/dL (ref 0.2–1.2)
Total Protein: 6.7 g/dL (ref 6.0–8.3)

## 2023-01-06 LAB — CBC WITH DIFFERENTIAL/PLATELET
Basophils Absolute: 0 10*3/uL (ref 0.0–0.1)
Basophils Relative: 0.5 % (ref 0.0–3.0)
Eosinophils Absolute: 0.1 10*3/uL (ref 0.0–0.7)
Eosinophils Relative: 2.8 % (ref 0.0–5.0)
HCT: 48.2 % (ref 39.0–52.0)
Hemoglobin: 15.8 g/dL (ref 13.0–17.0)
Lymphocytes Relative: 51.2 % — ABNORMAL HIGH (ref 12.0–46.0)
Lymphs Abs: 1.8 10*3/uL (ref 0.7–4.0)
MCHC: 32.8 g/dL (ref 30.0–36.0)
MCV: 99.1 fl (ref 78.0–100.0)
Monocytes Absolute: 0.4 10*3/uL (ref 0.1–1.0)
Monocytes Relative: 10.2 % (ref 3.0–12.0)
Neutro Abs: 1.3 10*3/uL — ABNORMAL LOW (ref 1.4–7.7)
Neutrophils Relative %: 35.3 % — ABNORMAL LOW (ref 43.0–77.0)
Platelets: 170 10*3/uL (ref 150.0–400.0)
RBC: 4.86 Mil/uL (ref 4.22–5.81)
RDW: 14.5 % (ref 11.5–15.5)
WBC: 3.6 10*3/uL — ABNORMAL LOW (ref 4.0–10.5)

## 2023-01-06 LAB — LIPID PANEL
Cholesterol: 263 mg/dL — ABNORMAL HIGH (ref 0–200)
HDL: 87.1 mg/dL (ref >39.00)
LDL Cholesterol: 155 mg/dL — ABNORMAL HIGH (ref 0–99)
NonHDL: 175.75
Total CHOL/HDL Ratio: 3
Triglycerides: 103 mg/dL (ref 0.0–149.0)
VLDL: 20.6 mg/dL (ref 0.0–40.0)

## 2023-01-06 LAB — HEMOGLOBIN A1C: Hgb A1c MFr Bld: 5.2 % (ref 4.6–6.5)

## 2023-01-06 NOTE — Patient Instructions (Signed)
Elbow Bursitis  Elbow bursitis is the swelling of the fluid-filled sac (bursa) at the tip of the elbow. A bursa is like a cushion that protects the joint. If the bursa becomes irritated, it can fill with extra fluid and become swollen. What are the causes? Injury to the elbow. Leaning the elbow on a hard surface for a long time. Infection. Bone spurs. Certain conditions that cause swelling. Sometimes, the cause is not known. What are the signs or symptoms? The first sign of this condition is often swelling at the tip of the elbow. The swelling can grow to the size of a golf ball. Other symptoms include: Pain when bending or leaning on the elbow. Stiffness of the elbow. If the cause is infection, you may have: Redness, warmth, and tenderness. Pus coming from a cut near the elbow. How is this treated? Treatment depends on the cause. It may include: Medicines. Draining fluid from the bursa. Placing a bandage or pressure (compression) sleeve around the elbow. Wearing elbow pads. Surgery, if other treatments do not help. Follow these instructions at home: Medicines Take over-the-counter and prescription medicines only as told by your doctor. If you were prescribed an antibiotic medicine, take it as told by your doctor. Do not stop taking it even if you start to feel better. Managing pain, stiffness, and swelling     If told, put ice on the elbow. To do this: Put ice in a plastic bag. Place a towel between your skin and the bag. Leave the ice on for 20 minutes, 2-3 times a day. Take off the ice if your skin turns bright red. This is very important. If you cannot feel pain, heat, or cold, you have a greater risk of damage to the area. If told, put heat on the affected area. Do this as often as told by your doctor. Use the heat source that your doctor recommends, such as a moist heat pack or a heating pad. Place a towel between your skin and the heat source. Leave the heat on for 20-30  minutes. Take off the heat if your skin turns bright red. This is very important. If you cannot feel pain, heat, or cold, you have a greater risk of getting burned. If your bursitis is caused by an injury, follow instructions from your doctor about: Resting your elbow. Wearing a bandage or sleeve. Wear elbow pads or elbow wraps as needed. These help cushion your elbow. General instructions Avoid any activities that cause elbow pain. Ask your doctor what activities are safe for you. Keep all follow-up visits. Contact a doctor if: You have a fever. You have problems that do not get better with treatment. You have pain or swelling that: Gets worse. Goes away and then comes back. You have pus draining from your elbow. You have redness around the elbow area. Your elbow feels warm to the touch. Get help right away if: You have trouble moving your arm, hand, or fingers. Summary Elbow bursitis is the swelling of the fluid-filled sac (bursa) at the tip of the elbow. You may need to take medicine or put ice on your elbow. Contact your doctor if your problems do not get better with treatment. Also, contact your doctor if your problems go away and then come back. This information is not intended to replace advice given to you by your health care provider. Make sure you discuss any questions you have with your health care provider. Document Revised: 07/15/2021 Document Reviewed: 07/15/2021 Elsevier Patient Education  2024 [employer].

## 2023-01-06 NOTE — Progress Notes (Signed)
Gerald Fisher 72 y.o.   Chief Complaint  Patient presents with   Annual Exam    Right arm pain, swollen , painful     HISTORY OF PRESENT ILLNESS: This is a 72 y.o. male here for follow-up of hypertension and dyslipidemia Also complaining of swelling to right elbow area Recently went to urgent care center and had it drained.  Doing better. Stopped drinking.  Non-smoker. No other comp pains or medical concerns today. BP Readings from Last 3 Encounters:  07/22/20 (!) 170/82  07/09/20 138/80  01/22/20 (!) 169/75     HPI   Prior to Admission medications   Medication Sig Start Date End Date Taking? Authorizing Provider  aspirin EC 81 MG tablet Take 81 mg by mouth daily.   Yes [provider]  lisinopril (ZESTRIL) 40 MG tablet TAKE 1 TABLET BY MOUTH EVERY DAY 12/01/22  Yes Domique Reardon, Eilleen Kempf, MD  MITIGARE 0.6 MG CAPS TAKE 1 CAPSULE BY MOUTH EVERY DAY 07/09/22  Yes Mateen Franssen, Eilleen Kempf, MD  rosuvastatin (CRESTOR) 20 MG tablet TAKE 1 TABLET BY MOUTH EVERY DAY Patient not taking: Reported on 01/06/2023 06/29/21   Georgina Quint, MD    No Known Allergies  Patient Active Problem List   Diagnosis Date Noted   Prediabetes 08/29/2018   History of gout 08/29/2018   Essential hypertension 05/24/2015    Past Medical History:  Diagnosis Date   Hypertension     Past Surgical History:  Procedure Laterality Date   FOOT SURGERY      Social History   Socioeconomic History   Marital status: Single    Spouse name: Not on file   Number of children: 2   Years of education: Not on file   Highest education level: Not on file  Occupational History   Not on file  Tobacco Use   Smoking status: Never   Smokeless tobacco: Never  Vaping Use   Vaping Use: Never used  Substance and Sexual Activity   Alcohol use: Yes   Drug use: No   Sexual activity: Yes    Birth control/protection: Condom  Other Topics Concern   Not on file  Social History Narrative   Not on  file   Social Determinants of Health   Financial Resource Strain: Not on file  Food Insecurity: Not on file  Transportation Needs: Not on file  Physical Activity: Not on file  Stress: Not on file  Social Connections: Not on file  Intimate Partner Violence: Not on file    Family History  Problem Relation Age of Onset   Heart disease Mother    Hypertension Father      Review of Systems  Constitutional: Negative.  Negative for chills and fever.  HENT: Negative.  Negative for congestion and sore throat.   Respiratory: Negative.  Negative for cough and shortness of breath.   Cardiovascular: Negative.  Negative for chest pain and palpitations.  Gastrointestinal:  Negative for abdominal pain, diarrhea, nausea and vomiting.  Genitourinary: Negative.  Negative for dysuria and hematuria.  Musculoskeletal: Negative.   Skin: Negative.  Negative for rash.  Neurological: Negative.  Negative for dizziness and headaches.  All other systems reviewed and are negative.   Today's Vitals   01/06/23 1053 01/06/23 1116 01/06/23 1119  BP: (!) 148/86 (!) 144/82 130/70  Pulse: (!) 55    Temp: 98.1 F (36.7 C)    TempSrc: Oral    SpO2: 98%    Weight: 194 lb 4 oz (88.1  kg)    Height: 5\' 10"  (1.778 m)     Body mass index is 27.87 kg/m.   Physical Exam Vitals reviewed.  Constitutional:      Appearance: Normal appearance.  HENT:     Head: Normocephalic.  Eyes:     Extraocular Movements: Extraocular movements intact.     Pupils: Pupils are equal, round, and reactive to light.  Cardiovascular:     Rate and Rhythm: Normal rate and regular rhythm.     Pulses: Normal pulses.     Heart sounds: Normal heart sounds.  Pulmonary:     Effort: Pulmonary effort is normal.     Breath sounds: Normal breath sounds.  Musculoskeletal:     Cervical back: No tenderness.     Comments: Right elbow: Olecranon bursitis.  No signs of infection.  Full range of motion.  Lymphadenopathy:     Cervical: No  cervical adenopathy.  Skin:    General: Skin is warm and dry.  Neurological:     Mental Status: He is alert and oriented to person, place, and time.  Psychiatric:        Mood and Affect: Mood normal.        Behavior: Behavior normal.      ASSESSMENT & PLAN: A total of 45 minutes was spent with the patient and counseling/coordination of care regarding preparing for this visit, review of most recent office visit notes, review of multiple chronic medical conditions and their management, review of all medications, cardiovascular risks associated with hypertension, education on nutrition, need for blood work today, prognosis, documentation and need for follow-up.  Problem List Items Addressed This Visit       Cardiovascular and Mediastinum   Essential hypertension - Primary    Well-controlled hypertension with normal blood pressure readings at home. Cardiovascular risks associated with hypertension discussed Dietary approaches to stop hypertension discussed Continue lisinopril 40 mg daily Blood work done today Benefits of exercise discussed Follow-up in 6 months      Relevant Orders   CBC with Differential/Platelet   Comprehensive metabolic panel   Hemoglobin A1c   Lipid panel     Musculoskeletal and Integument   Olecranon bursitis of right elbow    Clinically stable.  Known trigger. No complications. Running its course.        Other   Prediabetes    Hemoglobin A1c done today Diet and nutrition discussed Advised to decrease amount of daily carbohydrate intake and daily calories and increase amount of plant-based protein in his diet      History of gout    No recent flareups Continue Mitigare 0.6 mg daily      Relevant Orders   Uric acid   Dyslipidemia    Diet and nutrition discussed Lipid profile done today Continue rosuvastatin 20 mg daily The 10-year ASCVD risk score (Arnett DK, et al., 2019) is: 21.8%   Values used to calculate the score:     Age: 32  years     Sex: Male     Is Non-Hispanic African American: Yes     Diabetic: No     Tobacco smoker: No     Systolic Blood Pressure: 130 mmHg     Is BP treated: Yes     HDL Cholesterol: 67 mg/dL     Total Cholesterol: 288 mg/dL       Relevant Orders   CBC with Differential/Platelet   Comprehensive metabolic panel   Hemoglobin A1c   Lipid panel   Patient  Instructions  Elbow Bursitis  Elbow bursitis is the swelling of the fluid-filled sac (bursa) at the tip of the elbow. A bursa is like a cushion that protects the joint. If the bursa becomes irritated, it can fill with extra fluid and become swollen. What are the causes? Injury to the elbow. Leaning the elbow on a hard surface for a long time. Infection. Bone spurs. Certain conditions that cause swelling. Sometimes, the cause is not known. What are the signs or symptoms? The first sign of this condition is often swelling at the tip of the elbow. The swelling can grow to the size of a golf ball. Other symptoms include: Pain when bending or leaning on the elbow. Stiffness of the elbow. If the cause is infection, you may have: Redness, warmth, and tenderness. Pus coming from a cut near the elbow. How is this treated? Treatment depends on the cause. It may include: Medicines. Draining fluid from the bursa. Placing a bandage or pressure (compression) sleeve around the elbow. Wearing elbow pads. Surgery, if other treatments do not help. Follow these instructions at home: Medicines Take over-the-counter and prescription medicines only as told by your doctor. If you were prescribed an antibiotic medicine, take it as told by your doctor. Do not stop taking it even if you start to feel better. Managing pain, stiffness, and swelling     If told, put ice on the elbow. To do this: Put ice in a plastic bag. Place a towel between your skin and the bag. Leave the ice on for 20 minutes, 2-3 times a day. Take off the ice if your skin  turns bright red. This is very important. If you cannot feel pain, heat, or cold, you have a greater risk of damage to the area. If told, put heat on the affected area. Do this as often as told by your doctor. Use the heat source that your doctor recommends, such as a moist heat pack or a heating pad. Place a towel between your skin and the heat source. Leave the heat on for 20-30 minutes. Take off the heat if your skin turns bright red. This is very important. If you cannot feel pain, heat, or cold, you have a greater risk of getting burned. If your bursitis is caused by an injury, follow instructions from your doctor about: Resting your elbow. Wearing a bandage or sleeve. Wear elbow pads or elbow wraps as needed. These help cushion your elbow. General instructions Avoid any activities that cause elbow pain. Ask your doctor what activities are safe for you. Keep all follow-up visits. Contact a doctor if: You have a fever. You have problems that do not get better with treatment. You have pain or swelling that: Gets worse. Goes away and then comes back. You have pus draining from your elbow. You have redness around the elbow area. Your elbow feels warm to the touch. Get help right away if: You have trouble moving your arm, hand, or fingers. Summary Elbow bursitis is the swelling of the fluid-filled sac (bursa) at the tip of the elbow. You may need to take medicine or put ice on your elbow. Contact your doctor if your problems do not get better with treatment. Also, contact your doctor if your problems go away and then come back. This information is not intended to replace advice given to you by your health care provider. Make sure you discuss any questions you have with your health care provider. Document Revised: 07/15/2021 Document Reviewed: 07/15/2021 Elsevier  Patient Education  2024 Elsevier Inc.      Edwina Barth, MD [facility]

## 2023-01-06 NOTE — Assessment & Plan Note (Signed)
Well-controlled hypertension with normal blood pressure readings at home. Cardiovascular risks associated with hypertension discussed Dietary approaches to stop hypertension discussed Continue lisinopril 40 mg daily Blood work done today Benefits of exercise discussed Follow-up in 6 months

## 2023-01-06 NOTE — Assessment & Plan Note (Signed)
Diet and nutrition discussed Lipid profile done today Continue rosuvastatin 20 mg daily The 10-year ASCVD risk score (Arnett DK, et al., 2019) is: 21.8%   Values used to calculate the score:     Age: 72 years     Sex: Male     Is Non-Hispanic African American: Yes     Diabetic: No     Tobacco smoker: No     Systolic Blood Pressure: 130 mmHg     Is BP treated: Yes     HDL Cholesterol: 67 mg/dL     Total Cholesterol: 288 mg/dL

## 2023-01-06 NOTE — Assessment & Plan Note (Signed)
Hemoglobin A1c done today Diet and nutrition discussed Advised to decrease amount of daily carbohydrate intake and daily calories and increase amount of plant-based protein in his diet

## 2023-01-06 NOTE — Assessment & Plan Note (Signed)
No recent flareups Continue Mitigare 0.6 mg daily

## 2023-01-06 NOTE — Assessment & Plan Note (Signed)
Clinically stable.  Known trigger. No complications. Running its course.

## 2023-01-20 DIAGNOSIS — H2513 Age-related nuclear cataract, bilateral: Secondary | ICD-10-CM | POA: Diagnosis not present

## 2023-01-20 DIAGNOSIS — H524 Presbyopia: Secondary | ICD-10-CM | POA: Diagnosis not present

## 2023-01-20 DIAGNOSIS — H52223 Regular astigmatism, bilateral: Secondary | ICD-10-CM | POA: Diagnosis not present

## 2023-01-20 DIAGNOSIS — Z135 Encounter for screening for eye and ear disorders: Secondary | ICD-10-CM | POA: Diagnosis not present

## 2023-01-20 DIAGNOSIS — H5203 Hypermetropia, bilateral: Secondary | ICD-10-CM | POA: Diagnosis not present

## 2023-03-01 ENCOUNTER — Other Ambulatory Visit: Payer: Self-pay | Admitting: Emergency Medicine

## 2023-03-01 DIAGNOSIS — I10 Essential (primary) hypertension: Secondary | ICD-10-CM

## 2023-03-31 ENCOUNTER — Telehealth: Payer: Self-pay | Admitting: Emergency Medicine

## 2023-03-31 DIAGNOSIS — E785 Hyperlipidemia, unspecified: Secondary | ICD-10-CM

## 2023-03-31 MED ORDER — ROSUVASTATIN CALCIUM 20 MG PO TABS
20.0000 mg | ORAL_TABLET | Freq: Every day | ORAL | 3 refills | Status: DC
Start: 2023-03-31 — End: 2023-09-20

## 2023-03-31 NOTE — Telephone Encounter (Signed)
New prescription sent to patient pharmacy.

## 2023-03-31 NOTE — Telephone Encounter (Signed)
Prescription Request  03/31/2023  LOV: 01/06/2023  What is the name of the medication or equipment? rosuvastatin (CRESTOR) 20 MG tablet   Have you contacted your pharmacy to request a refill? Yes   Which pharmacy would you like this sent to?  [id number] [place], [place] - [address] [place] [address] Phone: [phone] Fax: [phone]    Patient notified that their request is being sent to the clinical staff for review and that they should receive a response within 2 business days.   Please advise at Mobile [phone] (mobile)

## 2023-04-18 DIAGNOSIS — R030 Elevated blood-pressure reading, without diagnosis of hypertension: Secondary | ICD-10-CM | POA: Diagnosis not present

## 2023-04-18 DIAGNOSIS — M25521 Pain in right elbow: Secondary | ICD-10-CM | POA: Diagnosis not present

## 2023-04-18 DIAGNOSIS — Z6827 Body mass index (BMI) 27.0-27.9, adult: Secondary | ICD-10-CM | POA: Diagnosis not present

## 2023-04-19 DIAGNOSIS — M25521 Pain in right elbow: Secondary | ICD-10-CM | POA: Diagnosis not present

## 2023-04-19 DIAGNOSIS — M25421 Effusion, right elbow: Secondary | ICD-10-CM | POA: Diagnosis not present

## 2023-04-20 DIAGNOSIS — H25043 Posterior subcapsular polar age-related cataract, bilateral: Secondary | ICD-10-CM | POA: Diagnosis not present

## 2023-04-20 DIAGNOSIS — H2512 Age-related nuclear cataract, left eye: Secondary | ICD-10-CM | POA: Diagnosis not present

## 2023-04-20 DIAGNOSIS — H25013 Cortical age-related cataract, bilateral: Secondary | ICD-10-CM | POA: Diagnosis not present

## 2023-04-20 DIAGNOSIS — H2513 Age-related nuclear cataract, bilateral: Secondary | ICD-10-CM | POA: Diagnosis not present

## 2023-04-20 DIAGNOSIS — H18413 Arcus senilis, bilateral: Secondary | ICD-10-CM | POA: Diagnosis not present

## 2023-05-10 ENCOUNTER — Ambulatory Visit (INDEPENDENT_AMBULATORY_CARE_PROVIDER_SITE_OTHER): Payer: Medicare HMO

## 2023-05-10 VITALS — BP 167/85 | HR 55 | Temp 98.1°F | Ht 70.0 in | Wt 194.0 lb

## 2023-05-10 DIAGNOSIS — Z0000 Encounter for general adult medical examination without abnormal findings: Secondary | ICD-10-CM | POA: Diagnosis not present

## 2023-05-10 NOTE — Progress Notes (Signed)
Subjective:   Gerald Fisher is a 72 y.o. male who presents for Medicare Annual/Subsequent preventive examination.  Visit Complete: Virtual  I connected with  Gerald Fisher on 05/10/23 by a audio enabled telemedicine application and verified that I am speaking with the correct person using two identifiers.  Patient Location: Home  Provider Location: Office/Clinic  I discussed the limitations of evaluation and management by telemedicine. The patient expressed understanding and agreed to proceed.  Patient Medicare AWV questionnaire was completed by the patient on N/A; I have confirmed that all information answered by patient is correct and no changes since this date.  Cardiac Risk Factors include: advanced age (>76men, >27 women);dyslipidemia;hypertension;male gender     Objective:    Today's Vitals   05/10/23 0945  BP: (!) 167/85  Pulse: (!) 55  Temp: 98.1 F (36.7 C)  SpO2: 98%  Weight: 194 lb (88 kg)  Height: 5\' 10"  (1.778 m)   Body mass index is 27.84 kg/m. BP taken by patient at home this morning, other vitals are taken from last office visit.      05/10/2023   10:28 AM 12/06/2018    9:05 AM  Advanced Directives  Does Patient Have a Medical Advance Directive? No No  Would patient like information on creating a medical advance directive? No - Patient declined Yes (ED - Information included in AVS)    Current Medications (verified) Outpatient Encounter Medications as of 05/10/2023  Medication Sig   aspirin EC 81 MG tablet Take 81 mg by mouth daily.   lisinopril (ZESTRIL) 40 MG tablet TAKE 1 TABLET BY MOUTH EVERY DAY   MITIGARE 0.6 MG CAPS TAKE 1 CAPSULE BY MOUTH EVERY DAY   rosuvastatin (CRESTOR) 20 MG tablet Take 1 tablet (20 mg total) by mouth daily.   No facility-administered encounter medications on file as of 05/10/2023.    Allergies (verified) Patient has no known allergies.   History: Past Medical History:  Diagnosis Date   Hypertension    Past  Surgical History:  Procedure Laterality Date   FOOT SURGERY     Family History  Problem Relation Age of Onset   Heart disease Mother    Hypertension Father    Social History   Socioeconomic History   Marital status: Single    Spouse name: Not on file   Number of children: 2   Years of education: Not on file   Highest education level: Not on file  Occupational History   Not on file  Tobacco Use   Smoking status: Never   Smokeless tobacco: Never  Vaping Use   Vaping status: Never Used  Substance and Sexual Activity   Alcohol use: Yes   Drug use: No   Sexual activity: Yes    Birth control/protection: Condom  Other Topics Concern   Not on file  Social History Narrative   Not on file   Social Determinants of Health   Financial Resource Strain: Low Risk  (05/10/2023)   Overall Financial Resource Strain (CARDIA)    Difficulty of Paying Living Expenses: Not hard at all  Food Insecurity: No Food Insecurity (05/10/2023)   Hunger Vital Sign    Worried About Running Out of Food in the Last Year: Never true    Ran Out of Food in the Last Year: Never true  Transportation Needs: No Transportation Needs (05/10/2023)   PRAPARE - Administrator, Civil Service (Medical): No    Lack of Transportation (Non-Medical): No  Physical  Activity: Sufficiently Active (05/10/2023)   Exercise Vital Sign    Days of Exercise per Week: 5 days    Minutes of Exercise per Session: 60 min  Stress: No Stress Concern Present (05/10/2023)   [employer] of Occupational Health - Occupational Stress Questionnaire    Feeling of Stress : Not at all  Social Connections: Moderately Integrated (05/10/2023)   Social Connection and Isolation Panel [NHANES]    Frequency of Communication with Friends and Family: More than three times a week    Frequency of Social Gatherings with Friends and Family: More than three times a week    Attends Religious Services: Never    Database administrator or  Organizations: Yes    Attends Engineer, structural: More than 4 times per year    Marital Status: Living with partner    Tobacco Counseling Counseling given: Not Answered   Clinical Intake:  Pre-visit preparation completed: Yes  Pain : No/denies pain     BMI - recorded: 27 Nutritional Status: BMI 25 -29 Overweight Nutritional Risks: None Diabetes: No  How often do you need to have someone help you when you read instructions, pamphlets, or other written materials from your doctor or pharmacy?: 1 - Never What is the last grade level you completed in school?: high school degree with [employer] certification  Interpreter Needed?: No  Information entered by :: Elyse Jarvis, CMA   Activities of Daily Living    05/10/2023   10:28 AM  In your present state of health, do you have any difficulty performing the following activities:  Hearing? 0  Vision? 0  Difficulty concentrating or making decisions? 0  Walking or climbing stairs? 0  Dressing or bathing? 0  Doing errands, shopping? 0  Preparing Food and eating ? N  Using the Toilet? N  In the past six months, have you accidently leaked urine? N  Do you have problems with loss of bowel control? N  Managing your Medications? N  Managing your Finances? N  Housekeeping or managing your Housekeeping? N    Patient Care Team: Georgina Quint, MD as PCP - General (Internal Medicine)  Indicate any recent Medical Services you may have received from other than [facility] providers in the past year (date may be approximate).     Assessment:   This is a routine wellness examination for Gerald Fisher.  Hearing/Vision screen Patient denied any hearing difficulty. No hearing aids. Patient does wear corrective lenses.   Goals Addressed               This Visit's Progress     Patient Stated (pt-stated)        I would like to continue to work on eating better to better to help manage my blood pressure.        Depression  Screen    05/10/2023   10:27 AM 01/06/2023   10:54 AM 07/22/2020   10:00 AM 07/09/2020   11:51 AM 01/22/2020    4:55 PM 02/07/2019    1:56 PM 12/06/2018    9:06 AM  PHQ 2/9 Scores  PHQ - 2 Score 0 0 0 0 0 0 0    Fall Risk    05/10/2023   10:28 AM 01/06/2023   10:54 AM 07/22/2020   10:00 AM 07/09/2020   11:05 AM 01/22/2020    4:55 PM  Fall Risk   Falls in the past year? 0 0 0 0 0  Number falls in past yr: 0 0  0  Injury with Fall? 0 0   0  Risk for fall due to : No Fall Risks No Fall Risks     Follow up Falls evaluation completed Falls evaluation completed Falls evaluation completed Falls evaluation completed Falls evaluation completed    MEDICARE RISK AT HOME: Medicare Risk at Home Any stairs in or around the home?: No If so, are there any without handrails?: No Home free of loose throw rugs in walkways, pet beds, electrical cords, etc?: Yes Adequate lighting in your home to reduce risk of falls?: Yes Life alert?: No Use of a cane, walker or w/c?: No Grab bars in the bathroom?: Yes Shower chair or bench in shower?: No Elevated toilet seat or a handicapped toilet?: Yes  TIMED UP AND GO:  Was the test performed?  No    Cognitive Function:  Patient is cogitatively intact.      05/10/2023   10:29 AM 12/06/2018    9:07 AM  6CIT Screen  What Year? 0 points 0 points  What month? 0 points 0 points  What time? 0 points 0 points  Count back from 20 0 points 0 points  Months in reverse 0 points 0 points  Repeat phrase 0 points 0 points  Total Score 0 points 0 points    Immunizations Immunization History  Administered Date(s) Administered   Fluad Quad(high Dose 65+) 07/09/2020   Influenza, High Dose Seasonal PF 05/11/2018, 05/26/2019   Influenza,inj,Quad PF,6+ Mos 08/17/2016   Influenza-Unspecified 05/26/2019, 06/18/2021   PFIZER(Purple Top)SARS-COV-2 Vaccination 06/10/2020, 06/18/2021   Pneumococcal Polysaccharide-23 08/29/2018   Tdap 08/29/2018    TDAP status: Up to  date  Flu Vaccine status: Due, Education has been provided regarding the importance of this vaccine. Advised may receive this vaccine at local pharmacy or Health Dept. Aware to provide a copy of the vaccination record if obtained from local pharmacy or Health Dept. Verbalized acceptance and understanding.  Pneumococcal vaccine status: Due, Education has been provided regarding the importance of this vaccine. Advised may receive this vaccine at local pharmacy or Health Dept. Aware to provide a copy of the vaccination record if obtained from local pharmacy or Health Dept. Verbalized acceptance and understanding.  Covid-19 vaccine status: Completed vaccines  Qualifies for Shingles Vaccine? Yes   Zostavax completed No   Shingrix Completed?: No.    Education has been provided regarding the importance of this vaccine. Patient has been advised to call insurance company to determine out of pocket expense if they have not yet received this vaccine. Advised may also receive vaccine at local pharmacy or Health Dept. Verbalized acceptance and understanding.  Screening Tests Health Maintenance  Topic Date Due   Pneumonia Vaccine 61+ Years old (2 of 2 - PCV) 08/30/2019   INFLUENZA VACCINE  03/04/2023   COVID-19 Vaccine (3 - 2023-24 season) 05/26/2023 (Originally 04/04/2023)   Zoster Vaccines- Shingrix (1 of 2) 09/08/2023 (Originally 07/30/1999)   Medicare Annual Wellness (AWV)  05/09/2024   Colonoscopy  04/28/2027   DTaP/Tdap/Td (2 - Td or Tdap) 08/29/2028   Hepatitis C Screening  Completed   HPV VACCINES  Aged Out    Health Maintenance  Health Maintenance Due  Topic Date Due   Pneumonia Vaccine 68+ Years old (2 of 2 - PCV) 08/30/2019   INFLUENZA VACCINE  03/04/2023    Colorectal cancer screening: Type of screening: Colonoscopy. Completed 04/27/2017. Repeat every 10 years  Lung Cancer Screening: (Low Dose CT Chest recommended if Age 74-80 years, 20 pack-year currently smoking  OR have quit w/in  15years.) does not qualify.   Lung Cancer Screening Referral: N/A  Additional Screening:  Hepatitis C Screening: does qualify; Completed 03/11/2017  Vision Screening: Recommended annual ophthalmology exams for early detection of glaucoma and other disorders of the eye. Is the patient up to date with their annual eye exam?  Yes  Who is the provider or what is the name of the office in which the patient attends annual eye exams? Dr. Waynard Edwards If pt is not established with a provider, would they like to be referred to a provider to establish care? No .   Dental Screening: Recommended annual dental exams for proper oral hygiene   Community Resource Referral / Chronic Care Management: CRR required this visit?  No   CCM required this visit?  No     Plan:     I have personally reviewed and noted the following in the patient's chart:   Medical and social history Use of alcohol, tobacco or illicit drugs  Current medications and supplements including opioid prescriptions. Patient is not currently taking opioid prescriptions. Functional ability and status Nutritional status Physical activity Advanced directives List of other physicians Hospitalizations, surgeries, and ER visits in previous 12 months Vitals Screenings to include cognitive, depression, and falls Referrals and appointments  In addition, I have reviewed and discussed with patient certain preventive protocols, quality metrics, and best practice recommendations. A written personalized care plan for preventive services as well as general preventive health recommendations were provided to patient.     Marinus Maw, CMA   05/10/2023   After Visit Summary: (MyChart) Due to this being a telephonic visit, the after visit summary with patients personalized plan was offered to patient via MyChart   Nurse Notes: N/A

## 2023-05-10 NOTE — Patient Instructions (Signed)
It was great speaking with you today!  Please schedule your next Medicare Wellness Visit with your Nurse Health Advisor in 1 year by calling [phone]. 

## 2023-05-28 ENCOUNTER — Other Ambulatory Visit: Payer: Self-pay | Admitting: Emergency Medicine

## 2023-05-28 DIAGNOSIS — I10 Essential (primary) hypertension: Secondary | ICD-10-CM

## 2023-06-16 DIAGNOSIS — H25812 Combined forms of age-related cataract, left eye: Secondary | ICD-10-CM | POA: Diagnosis not present

## 2023-06-16 DIAGNOSIS — H2512 Age-related nuclear cataract, left eye: Secondary | ICD-10-CM | POA: Diagnosis not present

## 2023-06-17 DIAGNOSIS — H2511 Age-related nuclear cataract, right eye: Secondary | ICD-10-CM | POA: Diagnosis not present

## 2023-08-10 ENCOUNTER — Other Ambulatory Visit: Payer: Self-pay | Admitting: Emergency Medicine

## 2023-08-10 ENCOUNTER — Other Ambulatory Visit: Payer: Self-pay | Admitting: Radiology

## 2023-08-10 ENCOUNTER — Telehealth: Payer: Self-pay | Admitting: Emergency Medicine

## 2023-08-10 DIAGNOSIS — Z8739 Personal history of other diseases of the musculoskeletal system and connective tissue: Secondary | ICD-10-CM

## 2023-08-10 MED ORDER — COLCHICINE 0.6 MG PO CAPS
0.6000 mg | ORAL_CAPSULE | Freq: Every day | ORAL | 3 refills | Status: DC
Start: 2023-08-10 — End: 2023-08-10

## 2023-08-10 NOTE — Telephone Encounter (Signed)
 Order refills as requested please.  Thanks.

## 2023-08-10 NOTE — Telephone Encounter (Signed)
 Copied from CRM [id number]. Topic: Clinical - Medication Refill >> Aug 10, 2023  1:39 PM Burnard DEL wrote: Most Recent Primary Care Visit:  Provider: TASTET, HANNAH E  Department: [facility]  Visit Type: MEDICARE AWV, SEQUENTIAL  Date: 05/10/2023  Medication: MITIGARE  0.6 MG CAPS  Has the patient contacted their pharmacy? Yes (Agent: If no, request that the patient contact the pharmacy for the refill. If patient does not wish to contact the pharmacy document the reason why and proceed with request.) (Agent: If yes, when and what did the pharmacy advise?)  Is this the correct pharmacy for this prescription? Yes If no, delete pharmacy and type the correct one.  This is the patient's preferred pharmacy:  [facility] [address] [place] - [address] [place] [address] Phone: [phone] Fax: [phone]   Has the prescription been filled recently? No  Is the patient out of the medication? Yes  Has the patient been seen for an appointment in the last year OR does the patient have an upcoming appointment? Yes  Can we respond through MyChart? Yes  Agent: Please be advised that Rx refills may take up to 3 business days. We ask that you follow-up with your pharmacy.

## 2023-08-10 NOTE — Telephone Encounter (Signed)
 Refill sent.

## 2023-08-11 ENCOUNTER — Other Ambulatory Visit: Payer: Self-pay | Admitting: Radiology

## 2023-08-11 ENCOUNTER — Encounter: Payer: Self-pay | Admitting: Radiology

## 2023-08-11 DIAGNOSIS — H2511 Age-related nuclear cataract, right eye: Secondary | ICD-10-CM | POA: Diagnosis not present

## 2023-08-11 DIAGNOSIS — Z8739 Personal history of other diseases of the musculoskeletal system and connective tissue: Secondary | ICD-10-CM

## 2023-08-11 MED ORDER — COLCHICINE 0.6 MG PO TABS
0.6000 mg | ORAL_TABLET | Freq: Every day | ORAL | 3 refills | Status: DC
Start: 2023-08-11 — End: 2023-09-20

## 2023-08-12 ENCOUNTER — Other Ambulatory Visit: Payer: Self-pay | Admitting: Emergency Medicine

## 2023-08-12 DIAGNOSIS — Z8739 Personal history of other diseases of the musculoskeletal system and connective tissue: Secondary | ICD-10-CM

## 2023-08-17 DIAGNOSIS — R5383 Other fatigue: Secondary | ICD-10-CM | POA: Diagnosis not present

## 2023-08-17 DIAGNOSIS — M25562 Pain in left knee: Secondary | ICD-10-CM | POA: Diagnosis not present

## 2023-08-17 DIAGNOSIS — M109 Gout, unspecified: Secondary | ICD-10-CM | POA: Diagnosis not present

## 2023-08-17 DIAGNOSIS — E559 Vitamin D deficiency, unspecified: Secondary | ICD-10-CM | POA: Diagnosis not present

## 2023-08-31 DIAGNOSIS — M109 Gout, unspecified: Secondary | ICD-10-CM | POA: Diagnosis not present

## 2023-08-31 DIAGNOSIS — E559 Vitamin D deficiency, unspecified: Secondary | ICD-10-CM | POA: Diagnosis not present

## 2023-09-01 DIAGNOSIS — Z0100 Encounter for examination of eyes and vision without abnormal findings: Secondary | ICD-10-CM | POA: Diagnosis not present

## 2023-09-01 DIAGNOSIS — H25811 Combined forms of age-related cataract, right eye: Secondary | ICD-10-CM | POA: Diagnosis not present

## 2023-09-01 DIAGNOSIS — Z961 Presence of intraocular lens: Secondary | ICD-10-CM | POA: Diagnosis not present

## 2023-09-17 ENCOUNTER — Other Ambulatory Visit: Payer: Self-pay | Admitting: Emergency Medicine

## 2023-09-17 DIAGNOSIS — I10 Essential (primary) hypertension: Secondary | ICD-10-CM

## 2023-09-17 NOTE — Telephone Encounter (Signed)
Copied from CRM [id number]. Topic: Clinical - Medication Refill >> Sep 17, 2023 11:16 AM Orinda Kenner C wrote: Most Recent Primary Care Visit:  Provider: Marinus Maw  Department: [facility]  Visit Type: MEDICARE AWV, SEQUENTIAL  Date: 05/10/2023  Medication: lisinopril (ZESTRIL) 40 MG tablet   Has the patient contacted their pharmacy? Yes (Agent: If no, request that the patient contact the pharmacy for the refill. If patient does not wish to contact the pharmacy document the reason why and proceed with request.) (Agent: If yes, when and what did the pharmacy advise?)  Is this the correct pharmacy for this prescription? Yes If no, delete pharmacy and type the correct one.  This is the patient's preferred pharmacy:  [facility] [address], [place] - [address] [place] [address] Phone: [phone] Fax: [phone]   Has the prescription been filled recently? No  Is the patient out of the medication? Yes  Has the patient been seen for an appointment in the last year OR does the patient have an upcoming appointment? Yes  Can we respond through MyChart? Yes  Agent: Please be advised that Rx refills may take up to 3 business days. We ask that you follow-up with your pharmacy.

## 2023-09-20 ENCOUNTER — Ambulatory Visit (INDEPENDENT_AMBULATORY_CARE_PROVIDER_SITE_OTHER): Payer: Medicare HMO | Admitting: Emergency Medicine

## 2023-09-20 ENCOUNTER — Encounter: Payer: Self-pay | Admitting: Emergency Medicine

## 2023-09-20 VITALS — BP 152/90 | HR 67 | Temp 97.9°F | Ht 70.0 in | Wt 202.0 lb

## 2023-09-20 DIAGNOSIS — R7303 Prediabetes: Secondary | ICD-10-CM | POA: Diagnosis not present

## 2023-09-20 DIAGNOSIS — E785 Hyperlipidemia, unspecified: Secondary | ICD-10-CM

## 2023-09-20 DIAGNOSIS — I10 Essential (primary) hypertension: Secondary | ICD-10-CM | POA: Diagnosis not present

## 2023-09-20 DIAGNOSIS — Z23 Encounter for immunization: Secondary | ICD-10-CM

## 2023-09-20 DIAGNOSIS — Z8739 Personal history of other diseases of the musculoskeletal system and connective tissue: Secondary | ICD-10-CM | POA: Diagnosis not present

## 2023-09-20 LAB — LIPID PANEL
Cholesterol: 227 mg/dL — ABNORMAL HIGH (ref 0–200)
HDL: 97.5 mg/dL (ref >39.00)
LDL Cholesterol: 97 mg/dL (ref 0–99)
NonHDL: 129.29
Total CHOL/HDL Ratio: 2
Triglycerides: 159 mg/dL — ABNORMAL HIGH (ref 0.0–149.0)
VLDL: 31.8 mg/dL (ref 0.0–40.0)

## 2023-09-20 LAB — COMPREHENSIVE METABOLIC PANEL
ALT: 30 U/L (ref 0–53)
AST: 20 U/L (ref 0–37)
Albumin: 3.8 g/dL (ref 3.5–5.2)
Alkaline Phosphatase: 80 U/L (ref 39–117)
BUN: 19 mg/dL (ref 6–23)
CO2: 28 meq/L (ref 19–32)
Calcium: 8.7 mg/dL (ref 8.4–10.5)
Chloride: 105 meq/L (ref 96–112)
Creatinine, Ser: 1.38 mg/dL (ref 0.40–1.50)
GFR: 50.51 mL/min — ABNORMAL LOW (ref >60.00)
Glucose, Bld: 93 mg/dL (ref 70–99)
Potassium: 3.8 meq/L (ref 3.5–5.1)
Sodium: 141 meq/L (ref 135–145)
Total Bilirubin: 0.8 mg/dL (ref 0.2–1.2)
Total Protein: 6.3 g/dL (ref 6.0–8.3)

## 2023-09-20 LAB — HEMOGLOBIN A1C: Hgb A1c MFr Bld: 5.8 % (ref 4.6–6.5)

## 2023-09-20 LAB — CBC WITH DIFFERENTIAL/PLATELET
Basophils Absolute: 0 10*3/uL (ref 0.0–0.1)
Basophils Relative: 0.5 % (ref 0.0–3.0)
Eosinophils Absolute: 0.1 10*3/uL (ref 0.0–0.7)
Eosinophils Relative: 2.4 % (ref 0.0–5.0)
HCT: 45.7 % (ref 39.0–52.0)
Hemoglobin: 15.3 g/dL (ref 13.0–17.0)
Lymphocytes Relative: 37.3 % (ref 12.0–46.0)
Lymphs Abs: 1.7 10*3/uL (ref 0.7–4.0)
MCHC: 33.6 g/dL (ref 30.0–36.0)
MCV: 96.4 fL (ref 78.0–100.0)
Monocytes Absolute: 0.5 10*3/uL (ref 0.1–1.0)
Monocytes Relative: 10.3 % (ref 3.0–12.0)
Neutro Abs: 2.2 10*3/uL (ref 1.4–7.7)
Neutrophils Relative %: 49.5 % (ref 43.0–77.0)
Platelets: 167 10*3/uL (ref 150.0–400.0)
RBC: 4.74 Mil/uL (ref 4.22–5.81)
RDW: 13.5 % (ref 11.5–15.5)
WBC: 4.5 10*3/uL (ref 4.0–10.5)

## 2023-09-20 LAB — VITAMIN D 25 HYDROXY (VIT D DEFICIENCY, FRACTURES): VITD: 29.83 ng/mL — ABNORMAL LOW (ref 30.00–100.00)

## 2023-09-20 LAB — URIC ACID: Uric Acid, Serum: 7.2 mg/dL (ref 4.0–7.8)

## 2023-09-20 LAB — VITAMIN B12: Vitamin B-12: 272 pg/mL (ref 211–911)

## 2023-09-20 MED ORDER — LISINOPRIL 40 MG PO TABS: 40.0000 mg | ORAL_TABLET | Freq: Every day | ORAL | 0 refills | Status: DC

## 2023-09-20 MED ORDER — COLCHICINE 0.6 MG PO TABS: 0.6000 mg | ORAL_TABLET | Freq: Every day | ORAL | 3 refills | Status: DC

## 2023-09-20 MED ORDER — ROSUVASTATIN CALCIUM 20 MG PO TABS: 20.0000 mg | ORAL_TABLET | Freq: Every day | ORAL | 3 refills | Status: DC

## 2023-09-20 NOTE — Assessment & Plan Note (Signed)
Hemoglobin A1c done today Diet and nutrition discussed Advised to decrease amount of daily carbohydrate intake and daily calories and increase amount of plant-based protein in his diet

## 2023-09-20 NOTE — Progress Notes (Signed)
 Gerald Fisher 73 y.o.   Chief Complaint  Patient presents with   Hypertension    Patient here for B/p f/u and medication refill.     HISTORY OF PRESENT ILLNESS: This is a 73 y.o. male here for 81-month follow-up of chronic medical conditions including hypertension and medication refills Overall doing well. Since her last visit he had cataract surgery in both eyes with good results Also had gout flareup to the left knee.  Prednisone made blood pressure go up No other complaints or medical concerns today. Has been off medication for 3 days now. BP Readings from Last 3 Encounters:  09/20/23 (!) 152/90  05/10/23 (!) 167/85  01/06/23 130/70   Wt Readings from Last 3 Encounters:  09/20/23 202 lb (91.6 kg)  05/10/23 194 lb (88 kg)  01/06/23 194 lb 4 oz (88.1 kg)     Hypertension Pertinent negatives include no chest pain, headaches, palpitations or shortness of breath.     Prior to Admission medications   Medication Sig Start Date End Date Taking? Authorizing Provider  aspirin EC 81 MG tablet Take 81 mg by mouth daily.   Yes [provider]  colchicine 0.6 MG tablet Take 1 tablet (0.6 mg total) by mouth daily. 08/11/23  Yes Marshon Bangs, Eilleen Kempf, MD  lisinopril (ZESTRIL) 40 MG tablet Take 1 tablet (40 mg total) by mouth daily. 09/20/23  Yes Fatih Stalvey, Eilleen Kempf, MD  rosuvastatin (CRESTOR) 20 MG tablet Take 1 tablet (20 mg total) by mouth daily. 03/31/23  Yes Georgina Quint, MD    No Known Allergies  Patient Active Problem List   Diagnosis Date Noted   Dyslipidemia 01/06/2023   Prediabetes 08/29/2018   History of gout 08/29/2018   Essential hypertension 05/24/2015    Past Medical History:  Diagnosis Date   Hypertension     Past Surgical History:  Procedure Laterality Date   FOOT SURGERY      Social History   Socioeconomic History   Marital status: Single    Spouse name: Not on file   Number of children: 2   Years of education: Not on file    Highest education level: Not on file  Occupational History   Not on file  Tobacco Use   Smoking status: Never   Smokeless tobacco: Never  Vaping Use   Vaping status: Never Used  Substance and Sexual Activity   Alcohol use: Yes   Drug use: No   Sexual activity: Yes    Birth control/protection: Condom  Other Topics Concern   Not on file  Social History Narrative   Not on file   Social Drivers of Health   Financial Resource Strain: Low Risk  (05/10/2023)   Overall Financial Resource Strain (CARDIA)    Difficulty of Paying Living Expenses: Not hard at all  Food Insecurity: No Food Insecurity (05/10/2023)   Hunger Vital Sign    Worried About Running Out of Food in the Last Year: Never true    Ran Out of Food in the Last Year: Never true  Transportation Needs: No Transportation Needs (05/10/2023)   PRAPARE - Administrator, Civil Service (Medical): No    Lack of Transportation (Non-Medical): No  Physical Activity: Sufficiently Active (05/10/2023)   Exercise Vital Sign    Days of Exercise per Week: 5 days    Minutes of Exercise per Session: 60 min  Stress: No Stress Concern Present (05/10/2023)   [employer] of Occupational Health - Occupational Stress Questionnaire  Feeling of Stress : Not at all  Social Connections: Moderately Integrated (05/10/2023)   Social Connection and Isolation Panel [NHANES]    Frequency of Communication with Friends and Family: More than three times a week    Frequency of Social Gatherings with Friends and Family: More than three times a week    Attends Religious Services: Never    Database administrator or Organizations: Yes    Attends Engineer, structural: More than 4 times per year    Marital Status: Living with partner  Intimate Partner Violence: Not At Risk (05/10/2023)   Humiliation, Afraid, Rape, and Kick questionnaire    Fear of Current or Ex-Partner: No    Emotionally Abused: No    Physically Abused: No    Sexually  Abused: No    Family History  Problem Relation Age of Onset   Heart disease Mother    Hypertension Father      Review of Systems  Constitutional: Negative.  Negative for chills and fever.  HENT: Negative.  Negative for congestion and sore throat.   Respiratory: Negative.  Negative for cough and shortness of breath.   Cardiovascular: Negative.  Negative for chest pain and palpitations.  Gastrointestinal:  Negative for abdominal pain, diarrhea, nausea and vomiting.  Genitourinary: Negative.  Negative for dysuria and hematuria.  Skin: Negative.  Negative for rash.  Neurological: Negative.  Negative for dizziness and headaches.  All other systems reviewed and are negative.   Vitals:   09/20/23 1315  BP: (!) 152/90  Pulse: 67  Temp: 97.9 F (36.6 C)  SpO2: 97%    Physical Exam Vitals reviewed.  Constitutional:      Appearance: Normal appearance.  HENT:     Head: Normocephalic.     Mouth/Throat:     Mouth: Mucous membranes are moist.     Pharynx: Oropharynx is clear.  Eyes:     Extraocular Movements: Extraocular movements intact.     Conjunctiva/sclera: Conjunctivae normal.     Pupils: Pupils are equal, round, and reactive to light.  Cardiovascular:     Rate and Rhythm: Normal rate and regular rhythm.     Pulses: Normal pulses.     Heart sounds: Normal heart sounds.  Pulmonary:     Effort: Pulmonary effort is normal.     Breath sounds: Normal breath sounds.  Abdominal:     Palpations: Abdomen is soft.     Tenderness: There is no abdominal tenderness.  Musculoskeletal:     Cervical back: No tenderness.  Lymphadenopathy:     Cervical: No cervical adenopathy.  Skin:    General: Skin is warm and dry.     Capillary Refill: Capillary refill takes less than 2 seconds.  Neurological:     General: No focal deficit present.     Mental Status: He is alert and oriented to person, place, and time.  Psychiatric:        Mood and Affect: Mood normal.        Behavior:  Behavior normal.      ASSESSMENT & PLAN: A total of 44 minutes was spent with the patient and counseling/coordination of care regarding preparing for this visit, review of most recent office visit notes, review of multiple chronic medical conditions and their management, cardiovascular risks associated with hypertension, review of all medications, review of most recent bloodwork results, review of health maintenance items, education on nutrition, prognosis, documentation, and need for follow up.   Problem List Items Addressed This Visit  Cardiovascular and Mediastinum   Essential hypertension - Primary   Well-controlled hypertension with normal blood pressure readings at home.  Elevated in the office today because he has been off medication for 3 days. Cardiovascular risks associated with hypertension discussed Dietary approaches to stop hypertension discussed Continue lisinopril 40 mg daily Blood work done today Benefits of exercise discussed Follow-up in 6 months      Relevant Medications   rosuvastatin (CRESTOR) 20 MG tablet   Other Relevant Orders   Comprehensive metabolic panel   CBC with Differential/Platelet   Vitamin B12   VITAMIN D 25 Hydroxy (Vit-D Deficiency, Fractures)     Other   Prediabetes   Hemoglobin A1c done today Diet and nutrition discussed Advised to decrease amount of daily carbohydrate intake and daily calories and increase amount of plant-based protein in his diet      Relevant Orders   Hemoglobin A1c   History of gout   Recent flareup on the left knee Recommend colchicine 0.6 mg daily Recent prednisone made blood pressure go real high      Relevant Medications   colchicine 0.6 MG tablet   Other Relevant Orders   Uric acid   Dyslipidemia   Diet and nutrition discussed Lipid profile done today Continue rosuvastatin 20 mg daily The 10-year ASCVD risk score (Arnett DK, et al., 2019) is: 26.5%   Values used to calculate the score:      Age: 69 years     Sex: Male     Is Non-Hispanic African American: Yes     Diabetic: No     Tobacco smoker: No     Systolic Blood Pressure: 152 mmHg     Is BP treated: Yes     HDL Cholesterol: 87.1 mg/dL     Total Cholesterol: 263 mg/dL       Relevant Medications   rosuvastatin (CRESTOR) 20 MG tablet   Other Relevant Orders   Lipid panel   Vitamin B12   VITAMIN D 25 Hydroxy (Vit-D Deficiency, Fractures)   Other Visit Diagnoses       Need for vaccination       Relevant Orders   Flu Vaccine Trivalent High Dose (Fluad)      Patient Instructions  Hypertension, Adult High blood pressure (hypertension) is when the force of blood pumping through the arteries is too strong. The arteries are the blood vessels that carry blood from the heart throughout the body. Hypertension forces the heart to work harder to pump blood and may cause arteries to become narrow or stiff. Untreated or uncontrolled hypertension can lead to a heart attack, heart failure, a stroke, kidney disease, and other problems. A blood pressure reading consists of a higher number over a lower number. Ideally, your blood pressure should be below 120/80. The first ("top") number is called the systolic pressure. It is a measure of the pressure in your arteries as your heart beats. The second ("bottom") number is called the diastolic pressure. It is a measure of the pressure in your arteries as the heart relaxes. What are the causes? The exact cause of this condition is not known. There are some conditions that result in high blood pressure. What increases the risk? Certain factors may make you more likely to develop high blood pressure. Some of these risk factors are under your control, including: Smoking. Not getting enough exercise or physical activity. Being overweight. Having too much fat, sugar, calories, or salt (sodium) in your diet. Drinking too much alcohol. Other  risk factors include: Having a personal history of  heart disease, diabetes, high cholesterol, or kidney disease. Stress. Having a family history of high blood pressure and high cholesterol. Having obstructive sleep apnea. Age. The risk increases with age. What are the signs or symptoms? High blood pressure may not cause symptoms. Very high blood pressure (hypertensive crisis) may cause: Headache. Fast or irregular heartbeats (palpitations). Shortness of breath. Nosebleed. Nausea and vomiting. Vision changes. Severe chest pain, dizziness, and seizures. How is this diagnosed? This condition is diagnosed by measuring your blood pressure while you are seated, with your arm resting on a flat surface, your legs uncrossed, and your feet flat on the floor. The cuff of the blood pressure monitor will be placed directly against the skin of your upper arm at the level of your heart. Blood pressure should be measured at least twice using the same arm. Certain conditions can cause a difference in blood pressure between your right and left arms. If you have a high blood pressure reading during one visit or you have normal blood pressure with other risk factors, you may be asked to: Return on a different day to have your blood pressure checked again. Monitor your blood pressure at home for 1 week or longer. If you are diagnosed with hypertension, you may have other blood or imaging tests to help your health care provider understand your overall risk for other conditions. How is this treated? This condition is treated by making healthy lifestyle changes, such as eating healthy foods, exercising more, and reducing your alcohol intake. You may be referred for counseling on a healthy diet and physical activity. Your health care provider may prescribe medicine if lifestyle changes are not enough to get your blood pressure under control and if: Your systolic blood pressure is above 130. Your diastolic blood pressure is above 80. Your personal target blood  pressure may vary depending on your medical conditions, your age, and other factors. Follow these instructions at home: Eating and drinking  Eat a diet that is high in fiber and potassium, and low in sodium, added sugar, and fat. An example of this eating plan is called the DASH diet. DASH stands for Dietary Approaches to Stop Hypertension. To eat this way: Eat plenty of fresh fruits and vegetables. Try to fill one half of your plate at each meal with fruits and vegetables. Eat whole grains, such as whole-wheat pasta, brown rice, or whole-grain bread. Fill about one fourth of your plate with whole grains. Eat or drink low-fat dairy products, such as skim milk or low-fat yogurt. Avoid fatty cuts of meat, processed or cured meats, and poultry with skin. Fill about one fourth of your plate with lean proteins, such as fish, chicken without skin, beans, eggs, or tofu. Avoid pre-made and processed foods. These tend to be higher in sodium, added sugar, and fat. Reduce your daily sodium intake. Many people with hypertension should eat less than 1,500 mg of sodium a day. Do not drink alcohol if: Your health care provider tells you not to drink. You are pregnant, may be pregnant, or are planning to become pregnant. If you drink alcohol: Limit how much you have to: 0-1 drink a day for women. 0-2 drinks a day for men. Know how much alcohol is in your drink. In the [place] one drink equals one 12 oz bottle of beer (355 mL), one 5 oz glass of wine (148 mL), or one 1 oz glass of hard liquor (44 mL). Lifestyle  Work with your health care provider to maintain a healthy body weight or to lose weight. Ask what an ideal weight is for you. Get at least 30 minutes of exercise that causes your heart to beat faster (aerobic exercise) most days of the week. Activities may include walking, swimming, or biking. Include exercise to strengthen your muscles (resistance exercise), such as Pilates or lifting weights, as part  of your weekly exercise routine. Try to do these types of exercises for 30 minutes at least 3 days a week. Do not use any products that contain nicotine or tobacco. These products include cigarettes, chewing tobacco, and vaping devices, such as e-cigarettes. If you need help quitting, ask your health care provider. Monitor your blood pressure at home as told by your health care provider. Keep all follow-up visits. This is important. Medicines Take over-the-counter and prescription medicines only as told by your health care provider. Follow directions carefully. Blood pressure medicines must be taken as prescribed. Do not skip doses of blood pressure medicine. Doing this puts you at risk for problems and can make the medicine less effective. Ask your health care provider about side effects or reactions to medicines that you should watch for. Contact a health care provider if you: Think you are having a reaction to a medicine you are taking. Have headaches that keep coming back (recurring). Feel dizzy. Have swelling in your ankles. Have trouble with your vision. Get help right away if you: Develop a severe headache or confusion. Have unusual weakness or numbness. Feel faint. Have severe pain in your chest or abdomen. Vomit repeatedly. Have trouble breathing. These symptoms may be an emergency. Get help right away. Call 911. Do not wait to see if the symptoms will go away. Do not drive yourself to the hospital. Summary Hypertension is when the force of blood pumping through your arteries is too strong. If this condition is not controlled, it may put you at risk for serious complications. Your personal target blood pressure may vary depending on your medical conditions, your age, and other factors. For most people, a normal blood pressure is less than 120/80. Hypertension is treated with lifestyle changes, medicines, or a combination of both. Lifestyle changes include losing weight, eating a  healthy, low-sodium diet, exercising more, and limiting alcohol. This information is not intended to replace advice given to you by your health care provider. Make sure you discuss any questions you have with your health care provider. Document Revised: 05/27/2021 Document Reviewed: 05/27/2021 Elsevier Patient Education  2024 Elsevier Inc.     Edwina Barth, MD [facility]

## 2023-09-20 NOTE — Assessment & Plan Note (Signed)
 Diet and nutrition discussed Lipid profile done today Continue rosuvastatin 20 mg daily The 10-year ASCVD risk score (Arnett DK, et al., 2019) is: 26.5%   Values used to calculate the score:     Age: 73 years     Sex: Male     Is Non-Hispanic African American: Yes     Diabetic: No     Tobacco smoker: No     Systolic Blood Pressure: 152 mmHg     Is BP treated: Yes     HDL Cholesterol: 87.1 mg/dL     Total Cholesterol: 263 mg/dL

## 2023-09-20 NOTE — Assessment & Plan Note (Signed)
 Well-controlled hypertension with normal blood pressure readings at home.  Elevated in the office today because he has been off medication for 3 days. Cardiovascular risks associated with hypertension discussed Dietary approaches to stop hypertension discussed Continue lisinopril 40 mg daily Blood work done today Benefits of exercise discussed Follow-up in 6 months

## 2023-09-20 NOTE — Assessment & Plan Note (Signed)
 Recent flareup on the left knee Recommend colchicine 0.6 mg daily Recent prednisone made blood pressure go real high

## 2023-09-20 NOTE — Patient Instructions (Signed)
 Hypertension, Adult High blood pressure (hypertension) is when the force of blood pumping through the arteries is too strong. The arteries are the blood vessels that carry blood from the heart throughout the body. Hypertension forces the heart to work harder to pump blood and may cause arteries to become narrow or stiff. Untreated or uncontrolled hypertension can lead to a heart attack, heart failure, a stroke, kidney disease, and other problems. A blood pressure reading consists of a higher number over a lower number. Ideally, your blood pressure should be below 120/80. The first ("top") number is called the systolic pressure. It is a measure of the pressure in your arteries as your heart beats. The second ("bottom") number is called the diastolic pressure. It is a measure of the pressure in your arteries as the heart relaxes. What are the causes? The exact cause of this condition is not known. There are some conditions that result in high blood pressure. What increases the risk? Certain factors may make you more likely to develop high blood pressure. Some of these risk factors are under your control, including: Smoking. Not getting enough exercise or physical activity. Being overweight. Having too much fat, sugar, calories, or salt (sodium) in your diet. Drinking too much alcohol. Other risk factors include: Having a personal history of heart disease, diabetes, high cholesterol, or kidney disease. Stress. Having a family history of high blood pressure and high cholesterol. Having obstructive sleep apnea. Age. The risk increases with age. What are the signs or symptoms? High blood pressure may not cause symptoms. Very high blood pressure (hypertensive crisis) may cause: Headache. Fast or irregular heartbeats (palpitations). Shortness of breath. Nosebleed. Nausea and vomiting. Vision changes. Severe chest pain, dizziness, and seizures. How is this diagnosed? This condition is diagnosed by  measuring your blood pressure while you are seated, with your arm resting on a flat surface, your legs uncrossed, and your feet flat on the floor. The cuff of the blood pressure monitor will be placed directly against the skin of your upper arm at the level of your heart. Blood pressure should be measured at least twice using the same arm. Certain conditions can cause a difference in blood pressure between your right and left arms. If you have a high blood pressure reading during one visit or you have normal blood pressure with other risk factors, you may be asked to: Return on a different day to have your blood pressure checked again. Monitor your blood pressure at home for 1 week or longer. If you are diagnosed with hypertension, you may have other blood or imaging tests to help your health care provider understand your overall risk for other conditions. How is this treated? This condition is treated by making healthy lifestyle changes, such as eating healthy foods, exercising more, and reducing your alcohol intake. You may be referred for counseling on a healthy diet and physical activity. Your health care provider may prescribe medicine if lifestyle changes are not enough to get your blood pressure under control and if: Your systolic blood pressure is above 130. Your diastolic blood pressure is above 80. Your personal target blood pressure may vary depending on your medical conditions, your age, and other factors. Follow these instructions at home: Eating and drinking  Eat a diet that is high in fiber and potassium, and low in sodium, added sugar, and fat. An example of this eating plan is called the DASH diet. DASH stands for Dietary Approaches to Stop Hypertension. To eat this way: Eat  plenty of fresh fruits and vegetables. Try to fill one half of your plate at each meal with fruits and vegetables. Eat whole grains, such as whole-wheat pasta, brown rice, or whole-grain bread. Fill about one  fourth of your plate with whole grains. Eat or drink low-fat dairy products, such as skim milk or low-fat yogurt. Avoid fatty cuts of meat, processed or cured meats, and poultry with skin. Fill about one fourth of your plate with lean proteins, such as fish, chicken without skin, beans, eggs, or tofu. Avoid pre-made and processed foods. These tend to be higher in sodium, added sugar, and fat. Reduce your daily sodium intake. Many people with hypertension should eat less than 1,500 mg of sodium a day. Do not drink alcohol if: Your health care provider tells you not to drink. You are pregnant, may be pregnant, or are planning to become pregnant. If you drink alcohol: Limit how much you have to: 0-1 drink a day for women. 0-2 drinks a day for men. Know how much alcohol is in your drink. In the [place] one drink equals one 12 oz bottle of beer (355 mL), one 5 oz glass of wine (148 mL), or one 1 oz glass of hard liquor (44 mL). Lifestyle  Work with your health care provider to maintain a healthy body weight or to lose weight. Ask what an ideal weight is for you. Get at least 30 minutes of exercise that causes your heart to beat faster (aerobic exercise) most days of the week. Activities may include walking, swimming, or biking. Include exercise to strengthen your muscles (resistance exercise), such as Pilates or lifting weights, as part of your weekly exercise routine. Try to do these types of exercises for 30 minutes at least 3 days a week. Do not use any products that contain nicotine or tobacco. These products include cigarettes, chewing tobacco, and vaping devices, such as e-cigarettes. If you need help quitting, ask your health care provider. Monitor your blood pressure at home as told by your health care provider. Keep all follow-up visits. This is important. Medicines Take over-the-counter and prescription medicines only as told by your health care provider. Follow directions carefully. Blood  pressure medicines must be taken as prescribed. Do not skip doses of blood pressure medicine. Doing this puts you at risk for problems and can make the medicine less effective. Ask your health care provider about side effects or reactions to medicines that you should watch for. Contact a health care provider if you: Think you are having a reaction to a medicine you are taking. Have headaches that keep coming back (recurring). Feel dizzy. Have swelling in your ankles. Have trouble with your vision. Get help right away if you: Develop a severe headache or confusion. Have unusual weakness or numbness. Feel faint. Have severe pain in your chest or abdomen. Vomit repeatedly. Have trouble breathing. These symptoms may be an emergency. Get help right away. Call 911. Do not wait to see if the symptoms will go away. Do not drive yourself to the hospital. Summary Hypertension is when the force of blood pumping through your arteries is too strong. If this condition is not controlled, it may put you at risk for serious complications. Your personal target blood pressure may vary depending on your medical conditions, your age, and other factors. For most people, a normal blood pressure is less than 120/80. Hypertension is treated with lifestyle changes, medicines, or a combination of both. Lifestyle changes include losing weight, eating a healthy,  low-sodium diet, exercising more, and limiting alcohol. This information is not intended to replace advice given to you by your health care provider. Make sure you discuss any questions you have with your health care provider. Document Revised: 05/27/2021 Document Reviewed: 05/27/2021 Elsevier Patient Education  2024 [employer].

## 2023-09-21 ENCOUNTER — Encounter: Payer: Self-pay | Admitting: Emergency Medicine

## 2023-12-16 ENCOUNTER — Other Ambulatory Visit: Payer: Self-pay | Admitting: Emergency Medicine

## 2023-12-16 DIAGNOSIS — I10 Essential (primary) hypertension: Secondary | ICD-10-CM

## 2024-03-14 ENCOUNTER — Other Ambulatory Visit: Payer: Self-pay | Admitting: Emergency Medicine

## 2024-03-14 DIAGNOSIS — I10 Essential (primary) hypertension: Secondary | ICD-10-CM

## 2024-03-20 ENCOUNTER — Ambulatory Visit: Payer: Medicare HMO | Admitting: Emergency Medicine

## 2024-05-02 DIAGNOSIS — M7031 Other bursitis of elbow, right elbow: Secondary | ICD-10-CM | POA: Diagnosis not present

## 2024-06-05 ENCOUNTER — Telehealth: Payer: Self-pay

## 2024-06-05 ENCOUNTER — Ambulatory Visit (INDEPENDENT_AMBULATORY_CARE_PROVIDER_SITE_OTHER)

## 2024-06-05 ENCOUNTER — Ambulatory Visit: Payer: Self-pay | Admitting: Emergency Medicine

## 2024-06-05 ENCOUNTER — Ambulatory Visit (INDEPENDENT_AMBULATORY_CARE_PROVIDER_SITE_OTHER): Admitting: Emergency Medicine

## 2024-06-05 ENCOUNTER — Encounter: Payer: Self-pay | Admitting: Emergency Medicine

## 2024-06-05 VITALS — BP 182/88 | HR 66 | Ht 69.5 in | Wt 195.0 lb

## 2024-06-05 VITALS — BP 180/80 | HR 66 | Temp 98.0°F | Ht 69.5 in | Wt 195.0 lb

## 2024-06-05 DIAGNOSIS — E785 Hyperlipidemia, unspecified: Secondary | ICD-10-CM | POA: Diagnosis not present

## 2024-06-05 DIAGNOSIS — Z13228 Encounter for screening for other metabolic disorders: Secondary | ICD-10-CM | POA: Diagnosis not present

## 2024-06-05 DIAGNOSIS — Z0000 Encounter for general adult medical examination without abnormal findings: Secondary | ICD-10-CM

## 2024-06-05 DIAGNOSIS — Z130 Encounter for screening for diseases of the blood and blood-forming organs and certain disorders involving the immune mechanism: Secondary | ICD-10-CM

## 2024-06-05 DIAGNOSIS — R7303 Prediabetes: Secondary | ICD-10-CM | POA: Diagnosis not present

## 2024-06-05 DIAGNOSIS — I10 Essential (primary) hypertension: Secondary | ICD-10-CM

## 2024-06-05 DIAGNOSIS — Z125 Encounter for screening for malignant neoplasm of prostate: Secondary | ICD-10-CM

## 2024-06-05 DIAGNOSIS — Z1329 Encounter for screening for other suspected endocrine disorder: Secondary | ICD-10-CM

## 2024-06-05 DIAGNOSIS — Z23 Encounter for immunization: Secondary | ICD-10-CM | POA: Diagnosis not present

## 2024-06-05 DIAGNOSIS — Z8739 Personal history of other diseases of the musculoskeletal system and connective tissue: Secondary | ICD-10-CM

## 2024-06-05 DIAGNOSIS — N1831 Chronic kidney disease, stage 3a: Secondary | ICD-10-CM

## 2024-06-05 DIAGNOSIS — Z0001 Encounter for general adult medical examination with abnormal findings: Secondary | ICD-10-CM

## 2024-06-05 LAB — CBC WITH DIFFERENTIAL/PLATELET
Basophils Absolute: 0 K/uL (ref 0.0–0.1)
Basophils Relative: 0.5 % (ref 0.0–3.0)
Eosinophils Absolute: 0.1 K/uL (ref 0.0–0.7)
Eosinophils Relative: 2.6 % (ref 0.0–5.0)
HCT: 44.9 % (ref 39.0–52.0)
Hemoglobin: 15.2 g/dL (ref 13.0–17.0)
Lymphocytes Relative: 42 % (ref 12.0–46.0)
Lymphs Abs: 1.3 K/uL (ref 0.7–4.0)
MCHC: 34 g/dL (ref 30.0–36.0)
MCV: 95.6 fl (ref 78.0–100.0)
Monocytes Absolute: 0.3 K/uL (ref 0.1–1.0)
Monocytes Relative: 10.4 % (ref 3.0–12.0)
Neutro Abs: 1.4 K/uL (ref 1.4–7.7)
Neutrophils Relative %: 44.5 % (ref 43.0–77.0)
Platelets: 124 K/uL — ABNORMAL LOW (ref 150.0–400.0)
RBC: 4.69 Mil/uL (ref 4.22–5.81)
RDW: 13.7 % (ref 11.5–15.5)
WBC: 3.2 K/uL — ABNORMAL LOW (ref 4.0–10.5)

## 2024-06-05 LAB — LIPID PANEL
Cholesterol: 214 mg/dL — ABNORMAL HIGH (ref 0–200)
HDL: 84.9 mg/dL (ref >39.00)
LDL Cholesterol: 102 mg/dL — ABNORMAL HIGH (ref 0–99)
NonHDL: 129.21
Total CHOL/HDL Ratio: 3
Triglycerides: 134 mg/dL (ref 0.0–149.0)
VLDL: 26.8 mg/dL (ref 0.0–40.0)

## 2024-06-05 LAB — COMPREHENSIVE METABOLIC PANEL WITH GFR
ALT: 21 U/L (ref 0–53)
AST: 16 U/L (ref 0–37)
Albumin: 4 g/dL (ref 3.5–5.2)
Alkaline Phosphatase: 77 U/L (ref 39–117)
BUN: 19 mg/dL (ref 6–23)
CO2: 28 meq/L (ref 19–32)
Calcium: 8.7 mg/dL (ref 8.4–10.5)
Chloride: 103 meq/L (ref 96–112)
Creatinine, Ser: 1.39 mg/dL (ref 0.40–1.50)
GFR: 49.82 mL/min — ABNORMAL LOW (ref >60.00)
Glucose, Bld: 98 mg/dL (ref 70–99)
Potassium: 4.3 meq/L (ref 3.5–5.1)
Sodium: 139 meq/L (ref 135–145)
Total Bilirubin: 0.9 mg/dL (ref 0.2–1.2)
Total Protein: 6.3 g/dL (ref 6.0–8.3)

## 2024-06-05 LAB — PSA: PSA: 1.14 ng/mL (ref 0.10–4.00)

## 2024-06-05 LAB — HEMOGLOBIN A1C: Hgb A1c MFr Bld: 5.7 % (ref 4.6–6.5)

## 2024-06-05 MED ORDER — VALSARTAN 160 MG PO TABS: 160.0000 mg | ORAL_TABLET | Freq: Every day | ORAL | 3 refills | Status: DC

## 2024-06-05 MED ORDER — COLCHICINE 0.6 MG PO TABS: 0.6000 mg | ORAL_TABLET | Freq: Every day | ORAL | 3 refills | Status: AC

## 2024-06-05 MED ORDER — EMPAGLIFLOZIN 10 MG PO TABS: 10.0000 mg | ORAL_TABLET | Freq: Every day | ORAL | 3 refills | Status: AC

## 2024-06-05 MED ORDER — DAPAGLIFLOZIN PROPANEDIOL 10 MG PO TABS: 10.0000 mg | ORAL_TABLET | Freq: Every day | ORAL | 3 refills | Status: DC

## 2024-06-05 NOTE — Patient Instructions (Signed)
 Health Maintenance, Male  Adopting a healthy lifestyle and getting preventive care are important in promoting health and wellness. Ask your health care provider about:  The right schedule for you to have regular tests and exams.  Things you can do on your own to prevent diseases and keep yourself healthy.  What should I know about diet, weight, and exercise?  Eat a healthy diet    Eat a diet that includes plenty of vegetables, fruits, low-fat dairy products, and lean protein.  Do not eat a lot of foods that are high in solid fats, added sugars, or sodium.  Maintain a healthy weight  Body mass index (BMI) is a measurement that can be used to identify possible weight problems. It estimates body fat based on height and weight. Your health care provider can help determine your BMI and help you achieve or maintain a healthy weight.  Get regular exercise  Get regular exercise. This is one of the most important things you can do for your health. Most adults should:  Exercise for at least 150 minutes each week. The exercise should increase your heart rate and make you sweat (moderate-intensity exercise).  Do strengthening exercises at least twice a week. This is in addition to the moderate-intensity exercise.  Spend less time sitting. Even light physical activity can be beneficial.  Watch cholesterol and blood lipids  Have your blood tested for lipids and cholesterol at 75 years of age, then have this test every 5 years.  You may need to have your cholesterol levels checked more often if:  Your lipid or cholesterol levels are high.  You are older than 75 years of age.  You are at high risk for heart disease.  What should I know about cancer screening?  Many types of cancers can be detected early and may often be prevented. Depending on your health history and family history, you may need to have cancer screening at various ages. This may include screening for:  Colorectal cancer.  Prostate cancer.  Skin cancer.  Lung  cancer.  What should I know about heart disease, diabetes, and high blood pressure?  Blood pressure and heart disease  High blood pressure causes heart disease and increases the risk of stroke. This is more likely to develop in people who have high blood pressure readings or are overweight.  Talk with your health care provider about your target blood pressure readings.  Have your blood pressure checked:  Every 3-5 years if you are 75-75 years of age.  Every year if you are 3 years old or older.  If you are between the ages of 75 and 75 and are a current or former smoker, ask your health care provider if you should have a one-time screening for abdominal aortic aneurysm (AAA).  Diabetes  Have regular diabetes screenings. This checks your fasting blood sugar level. Have the screening done:  Once every three years after age 75 if you are at a normal weight and have a low risk for diabetes.  More often and at a younger age if you are overweight or have a high risk for diabetes.  What should I know about preventing infection?  Hepatitis B  If you have a higher risk for hepatitis B, you should be screened for this virus. Talk with your health care provider to find out if you are at risk for hepatitis B infection.  Hepatitis C  Blood testing is recommended for:  Everyone born from [birth year] through 1967.  Anyone  with known risk factors for hepatitis C.  Sexually transmitted infections (STIs)  You should be screened each year for STIs, including gonorrhea and chlamydia, if:  You are sexually active and are younger than 75 years of age.  You are older than 75 years of age and your health care provider tells you that you are at risk for this type of infection.  Your sexual activity has changed since you were last screened, and you are at increased risk for chlamydia or gonorrhea. Ask your health care provider if you are at risk.  Ask your health care provider about whether you are at high risk for HIV. Your health care provider  may recommend a prescription medicine to help prevent HIV infection. If you choose to take medicine to prevent HIV, you should first get tested for HIV. You should then be tested every 3 months for as long as you are taking the medicine.  Follow these instructions at home:  Alcohol use  Do not drink alcohol if your health care provider tells you not to drink.  If you drink alcohol:  Limit how much you have to 0-2 drinks a day.  Know how much alcohol is in your drink. In the [place] one drink equals one 12 oz bottle of beer (355 mL), one 5 oz glass of wine (148 mL), or one 1 oz glass of hard liquor (44 mL).  Lifestyle  Do not use any products that contain nicotine or tobacco. These products include cigarettes, chewing tobacco, and vaping devices, such as e-cigarettes. If you need help quitting, ask your health care provider.  Do not use street drugs.  Do not share needles.  Ask your health care provider for help if you need support or information about quitting drugs.  General instructions  Schedule regular health, dental, and eye exams.  Stay current with your vaccines.  Tell your health care provider if:  You often feel depressed.  You have ever been abused or do not feel safe at home.  Summary  Adopting a healthy lifestyle and getting preventive care are important in promoting health and wellness.  Follow your health care provider's instructions about healthy diet, exercising, and getting tested or screened for diseases.  Follow your health care provider's instructions on monitoring your cholesterol and blood pressure.  This information is not intended to replace advice given to you by your health care provider. Make sure you discuss any questions you have with your health care provider.  Document Revised: 12/09/2020 Document Reviewed: 12/09/2020  Elsevier Patient Education  2024 [employer].

## 2024-06-05 NOTE — Telephone Encounter (Signed)
**Note De-identified  [place] Obfuscation** Please advise 

## 2024-06-05 NOTE — Patient Instructions (Addendum)
 Mr. Gerald Fisher,  Thank you for taking the time for your Medicare Wellness Visit. I appreciate your continued commitment to your health goals. Please review the care plan we discussed, and feel free to reach out if I can assist you further.  Medicare recommends these wellness visits once per year to help you and your care team stay ahead of potential health issues. These visits are designed to focus on prevention, allowing your provider to concentrate on managing your acute and chronic conditions during your regular appointments.  Please note that Annual Wellness Visits do not include a physical exam. Some assessments may be limited, especially if the visit was conducted virtually. If needed, we may recommend a separate in-person follow-up with your provider.  Ongoing Care Seeing your primary care provider every 3 to 6 months helps us  monitor your health and provide consistent, personalized care.   Referrals If a referral was made during today's visit and you haven't received any updates within two weeks, please contact the referred provider directly to check on the status.  Recommended Screenings:  Health Maintenance  Topic Date Due   Zoster (Shingles) Vaccine (1 of 2) Never done   COVID-19 Vaccine (3 - 2025-26 season) 04/03/2024   Medicare Annual Wellness Visit  06/05/2025   Colon Cancer Screening  04/28/2027   DTaP/Tdap/Td vaccine (2 - Td or Tdap) 08/29/2028   Pneumococcal Vaccine for age over 48  Completed   Flu Shot  Completed   Hepatitis C Screening  Completed   Meningitis B Vaccine  Aged Out       06/05/2024    8:13 AM  Advanced Directives  Does Patient Have a Medical Advance Directive? No  Would patient like information on creating a medical advance directive? No - Guardian declined   Advance Care Planning is important because it: Ensures you receive medical care that aligns with your values, goals, and preferences. Provides guidance to your family and loved ones, reducing the  emotional burden of decision-making during critical moments.  Vision: Annual vision screenings are recommended for early detection of glaucoma, cataracts, and diabetic retinopathy. These exams can also reveal signs of chronic conditions such as diabetes and high blood pressure.  Dental: Annual dental screenings help detect early signs of oral cancer, gum disease, and other conditions linked to overall health, including heart disease and diabetes.

## 2024-06-05 NOTE — Progress Notes (Addendum)
 Subjective:   Gerald Fisher is a 73 y.o. male who presents for a Medicare Annual Wellness Visit.  Allergies (verified) Patient has no known allergies.   History: Past Medical History:  Diagnosis Date   Hypertension    Past Surgical History:  Procedure Laterality Date   FOOT SURGERY     Family History  Problem Relation Age of Onset   Heart disease Mother    Hypertension Father    Social History   Occupational History   Not on file  Tobacco Use   Smoking status: Never   Smokeless tobacco: Never  Vaping Use   Vaping status: Never Used  Substance and Sexual Activity   Alcohol use: Yes    Alcohol/week: 1.0 standard drink of alcohol    Types: 1 Glasses of wine per week    Comment: socially   Drug use: No   Sexual activity: Yes    Birth control/protection: Condom   Tobacco Counseling Counseling given: Not Answered  SDOH Screenings   Food Insecurity: No Food Insecurity (06/05/2024)  Housing: Low Risk  (06/05/2024)  Transportation Needs: No Transportation Needs (06/05/2024)  Utilities: Not At Risk (06/05/2024)  Alcohol Screen: Low Risk  (05/10/2023)  Depression (PHQ2-9): Low Risk  (06/05/2024)  Financial Resource Strain: Low Risk  (05/10/2023)  Physical Activity: Sufficiently Active (06/05/2024)  Social Connections: Socially Integrated (06/05/2024)  Stress: No Stress Concern Present (06/05/2024)  Tobacco Use: Low Risk  (06/05/2024)  Health Literacy: Adequate Health Literacy (06/05/2024)   Depression Screen    06/05/2024    9:00 AM 06/05/2024    8:42 AM 09/20/2023    1:22 PM 05/10/2023   10:27 AM 01/06/2023   10:54 AM 07/22/2020   10:00 AM 07/09/2020   11:51 AM  PHQ 2/9 Scores  PHQ - 2 Score 0 0 0 0 0 0 0  PHQ- 9 Score 0 0       Exception Documentation Other- indicate reason in comment box        Not completed --           Goals Addressed               This Visit's Progress     Patient Stated (pt-stated)        Patient stated he plans to continue monitor bp  readings and staying active       Visit info / Clinical Intake: Medicare Wellness Visit Type:: Subsequent Annual Wellness Visit Medicare Wellness Visit Mode:: In-person (required for WTM) Interpreter Needed?: No Pre-visit prep was completed: no AWV questionnaire completed by patient prior to visit?: no Living arrangements:: lives with spouse/significant other Patient's Overall Health Status Rating: good Typical amount of pain: none Does pain affect daily life?: no Are you currently prescribed opioids?: no  Dietary Habits and Nutritional Risks How many meals a day?: 2 Eats fruit and vegetables daily?: yes Most meals are obtained by: preparing own meals In the last 2 weeks, have you had any of the following?: -- (none) Diabetic:: no  Functional Status Activities of Daily Living (to include ambulation/medication): Independent Ambulation: Independent with device- listed below Home Assistive Devices/Equipment: Eyeglasses Medication Administration: Independent Home Management: Independent Manage your own finances?: yes Primary transportation is: driving Concerns about vision?: no *vision screening is required for WTM* Concerns about hearing?: no  Fall Screening Falls in the past year?: 0 Number of falls in past year: 0 Was there an injury with Fall?: 0 Fall Risk Category Calculator: 0 Patient Fall Risk Level: Low  Fall Risk  Fall Risk Patient at Risk for Falls Due to: No Fall Risks Fall risk Follow up: Falls prevention discussed  Home and Transportation Safety: All rugs have non-skid backing?: yes All stairs or steps have railings?: N/A, no stairs Grab bars in the bathtub or shower?: (!) no Have non-skid surface in bathtub or shower?: (!) no Good home lighting?: yes Regular seat belt use?: yes Hospital stays in the last year:: no  Cognitive Assessment Difficulty concentrating, remembering, or making decisions? : no Will 6CIT or Mini Cog be Completed: yes What year  is it?: 0 points What month is it?: 0 points Give patient an address phrase to remember (5 components): [address] [place], [place] About what time is it?: 0 points Count backwards from 20 to 1: 0 points Say the months of the year in reverse: 0 points Repeat the address phrase from earlier: 2 points 6 CIT Score: 2 points  Advance Directives (For Healthcare) Does Patient Have a Medical Advance Directive?: No Would patient like information on creating a medical advance directive?: No - Guardian declined  Reviewed/Updated  Reviewed/Updated: All        Objective:    Today's Vitals   06/05/24 0812 06/05/24 0841  BP: (!) 204/90 (!) 182/88  Pulse: 66   SpO2: 98%   Weight: 195 lb (88.5 kg)   Height: 5' 9.5 (1.765 m)    Body mass index is 28.38 kg/m.  Current Medications (verified) Outpatient Encounter Medications as of 06/05/2024  Medication Sig   aspirin EC 81 MG tablet Take 81 mg by mouth daily.   colchicine  0.6 MG tablet Take 1 tablet (0.6 mg total) by mouth daily.   rosuvastatin  (CRESTOR ) 20 MG tablet Take 1 tablet (20 mg total) by mouth daily.   valsartan (DIOVAN) 160 MG tablet Take 1 tablet (160 mg total) by mouth daily.   [DISCONTINUED] colchicine  0.6 MG tablet Take 1 tablet (0.6 mg total) by mouth daily.   [DISCONTINUED] lisinopril  (ZESTRIL ) 40 MG tablet TAKE 1 TABLET BY MOUTH EVERY DAY   No facility-administered encounter medications on file as of 06/05/2024.   Hearing/Vision screen Hearing Screening - Comments:: Denies hearing difficulties   Vision Screening - Comments:: Wears rx glasses - up to date with routine eye exams  Immunizations and Health Maintenance Health Maintenance  Topic Date Due   Zoster Vaccines- Shingrix (1 of 2) Never done   COVID-19 Vaccine (3 - 2025-26 season) 04/03/2024   Medicare Annual Wellness (AWV)  06/05/2025   Colonoscopy  04/28/2027   DTaP/Tdap/Td (2 - Td or Tdap) 08/29/2028   Pneumococcal Vaccine: 50+ Years  Completed   Influenza  Vaccine  Completed   Hepatitis C Screening  Completed   Meningococcal B Vaccine  Aged Out        Assessment/Plan:  This is a routine wellness examination for [place].  Patient Care Team: Purcell Emil Schanz, MD as PCP - General (Internal Medicine)  I have personally reviewed and noted the following in the patient's chart:   Medical and social history Use of alcohol, tobacco or illicit drugs  Current medications and supplements including opioid prescriptions. Functional ability and status Nutritional status Physical activity Advanced directives List of other physicians Hospitalizations, surgeries, and ER visits in previous 12 months Vitals Screenings to include cognitive, depression, and falls Referrals and appointments  Orders Placed This Encounter  Procedures   Pneumococcal conjugate vaccine 20-valent (Prevnar 20)   Flu vaccine HIGH DOSE PF(Fluzone Trivalent)   In addition, I have  reviewed and discussed with patient certain preventive protocols, quality metrics, and best practice recommendations. A written personalized care plan for preventive services as well as general preventive health recommendations were provided to patient.   Verdie CHRISTELLA Saba, CMA   06/05/2024   Return in 1 year (on 06/05/2025).  After Visit Summary: (In Person-Declined) Patient declined AVS at this time.  Nurse Notes: Scheduled 1-yr CPE/AWV.

## 2024-06-05 NOTE — Assessment & Plan Note (Signed)
Hemoglobin A1c done today Diet and nutrition discussed Advised to decrease amount of daily carbohydrate intake and daily calories and increase amount of plant-based protein in his diet

## 2024-06-05 NOTE — Progress Notes (Deleted)
 Subjective:   Gerald Fisher is a 73 y.o. who presents for a Medicare Wellness preventive visit.  As a reminder, Annual Wellness Visits don't include a physical exam, and some assessments may be limited, especially if this visit is performed virtually. We may recommend an in-person follow-up visit with your provider if needed.  Visit Complete: In person  Persons Participating in Visit: Patient.  AWV Questionnaire: No: Patient Medicare AWV questionnaire was not completed prior to this visit.        Objective:    Today's Vitals   06/05/24 0812 06/05/24 0841  BP: (!) 204/90 (!) 182/88  Pulse: 66   SpO2: 98%   Weight: 195 lb (88.5 kg)   Height: 5' 9.5 (1.765 m)    Body mass index is 28.38 kg/m.     06/05/2024    9:00 AM 06/05/2024    8:13 AM 05/10/2023   10:28 AM 12/06/2018    9:05 AM  Advanced Directives  Does Patient Have a Medical Advance Directive? No No No No  Would patient like information on creating a medical advance directive? No - Guardian declined No - Guardian declined No - Patient declined Yes (ED - Information included in AVS)      Data saved with a previous flowsheet row definition    Current Medications (verified) Outpatient Encounter Medications as of 06/05/2024  Medication Sig   aspirin EC 81 MG tablet Take 81 mg by mouth daily.   rosuvastatin  (CRESTOR ) 20 MG tablet Take 1 tablet (20 mg total) by mouth daily.   [DISCONTINUED] colchicine  0.6 MG tablet Take 1 tablet (0.6 mg total) by mouth daily.   [DISCONTINUED] lisinopril  (ZESTRIL ) 40 MG tablet TAKE 1 TABLET BY MOUTH EVERY DAY   No facility-administered encounter medications on file as of 06/05/2024.    Allergies (verified) Patient has no known allergies.   History: Past Medical History:  Diagnosis Date   Hypertension    Past Surgical History:  Procedure Laterality Date   FOOT SURGERY     Family History  Problem Relation Age of Onset   Heart disease Mother    Hypertension Father     Social History   Socioeconomic History   Marital status: Single    Spouse name: Not on file   Number of children: 2   Years of education: Not on file   Highest education level: Not on file  Occupational History   Not on file  Tobacco Use   Smoking status: Never   Smokeless tobacco: Never  Vaping Use   Vaping status: Never Used  Substance and Sexual Activity   Alcohol use: Yes    Alcohol/week: 1.0 standard drink of alcohol    Types: 1 Glasses of wine per week    Comment: socially   Drug use: No   Sexual activity: Yes    Birth control/protection: Condom  Other Topics Concern   Not on file  Social History Narrative   Not on file   Social Drivers of Health   Financial Resource Strain: Low Risk  (05/10/2023)   Overall Financial Resource Strain (CARDIA)    Difficulty of Paying Living Expenses: Not hard at all  Food Insecurity: No Food Insecurity (06/05/2024)   Hunger Vital Sign    Worried About Running Out of Food in the Last Year: Never true    Ran Out of Food in the Last Year: Never true  Transportation Needs: No Transportation Needs (06/05/2024)   PRAPARE - Transportation    Lack of  Transportation (Medical): No    Lack of Transportation (Non-Medical): No  Physical Activity: Sufficiently Active (06/05/2024)   Exercise Vital Sign    Days of Exercise per Week: 5 days    Minutes of Exercise per Session: 60 min  Stress: No Stress Concern Present (06/05/2024)   [employer] of Occupational Health - Occupational Stress Questionnaire    Feeling of Stress: Not at all  Social Connections: Socially Integrated (06/05/2024)   Social Connection and Isolation Panel    Frequency of Communication with Friends and Family: More than three times a week    Frequency of Social Gatherings with Friends and Family: Not on file    Attends Religious Services: More than 4 times per year    Active Member of Clubs or Organizations: No    Attends Engineer, Structural: More than 4  times per year    Marital Status: Living with partner    Tobacco Counseling Counseling given: Not Answered    Clinical Intake:              Lab Results  Component Value Date   HGBA1C 5.8 09/20/2023   HGBA1C 5.2 01/06/2023   HGBA1C 5.7 (H) 07/09/2020        Interpreter Needed?: No      Activities of Daily Living      No data to display          Patient Care Team: Purcell Emil Schanz, MD as PCP - General (Internal Medicine)  I have updated your Care Teams any recent Medical Services you may have received from other providers in the past year.     Assessment:   This is a routine wellness examination for [name].  Hearing/Vision screen Hearing Screening - Comments:: Denies hearing difficulties   Vision Screening - Comments:: Wears rx glasses - up to date with routine eye exams    Goals Addressed               This Visit's Progress     Patient Stated (pt-stated)        Patient stated he plans to continue monitor bp readings and staying active       Depression Screen     06/05/2024    9:00 AM 06/05/2024    8:42 AM 09/20/2023    1:22 PM 05/10/2023   10:27 AM 01/06/2023   10:54 AM 07/22/2020   10:00 AM 07/09/2020   11:51 AM  PHQ 2/9 Scores  PHQ - 2 Score 0 0 0 0 0 0 0  PHQ- 9 Score 0 0       Exception Documentation Other- indicate reason in comment box        Not completed --          Fall Risk     06/05/2024    9:00 AM 06/05/2024    8:13 AM 09/20/2023    1:22 PM 05/10/2023   10:28 AM 01/06/2023   10:54 AM  Fall Risk   Falls in the past year? 0 0 0 0 0  Number falls in past yr: 0 0 0 0 0  Injury with Fall? 0 0 0 0 0  Risk for fall due to : No Fall Risks No Fall Risks No Fall Risks No Fall Risks No Fall Risks  Follow up Falls prevention discussed Falls prevention discussed;Falls evaluation completed Falls evaluation completed Falls evaluation completed Falls evaluation completed    MEDICARE RISK AT HOME:     TIMED UP AND GO:  Was the  test performed?  No  Cognitive Function: 6CIT completed        06/05/2024    9:00 AM 06/05/2024    8:13 AM 05/10/2023   10:29 AM 12/06/2018    9:07 AM  6CIT Screen  What Year? 0 points 0 points 0 points 0 points  What month? 0 points 0 points 0 points 0 points  What time? 0 points 0 points 0 points 0 points  Count back from 20 0 points 0 points 0 points 0 points  Months in reverse 0 points 0 points 0 points 0 points  Repeat phrase 2 points 2 points 0 points 0 points  Total Score 2 points 2 points 0 points 0 points    Immunizations Immunization History  Administered Date(s) Administered   Fluad Quad(high Dose 65+) 07/09/2020   Fluad Trivalent(High Dose 65+) 09/20/2023   INFLUENZA, HIGH DOSE SEASONAL PF 05/11/2018, 05/26/2019, 06/05/2024   Influenza,inj,Quad PF,6+ Mos 08/17/2016   Influenza-Unspecified 05/26/2019, 06/18/2021   PFIZER(Purple Top)SARS-COV-2 Vaccination 06/10/2020, 06/18/2021   PNEUMOCOCCAL CONJUGATE-20 06/05/2024   Pneumococcal Polysaccharide-23 08/29/2018   Tdap 08/29/2018    Screening Tests Health Maintenance  Topic Date Due   Zoster Vaccines- Shingrix (1 of 2) Never done   COVID-19 Vaccine (3 - 2025-26 season) 04/03/2024   Medicare Annual Wellness (AWV)  06/05/2025   Colonoscopy  04/28/2027   DTaP/Tdap/Td (2 - Td or Tdap) 08/29/2028   Pneumococcal Vaccine: 50+ Years  Completed   Influenza Vaccine  Completed   Hepatitis C Screening  Completed   Meningococcal B Vaccine  Aged Out    Health Maintenance Items Addressed:  Vaccines Given today: Influenza High-Dose and Pneumococcal  Additional Screening:  Vision Screening: Recommended annual ophthalmology exams for early detection of glaucoma and other disorders of the eye. Is the patient up to date with their annual eye exam?  Yes   Dental Screening: Recommended annual dental exams for proper oral hygiene  Community Resource Referral / Chronic Care Management: CRR required this visit?  No   CCM  required this visit?  No   Plan:    I have personally reviewed and noted the following in the patient's chart:   Medical and social history Use of alcohol, tobacco or illicit drugs  Current medications and supplements including opioid prescriptions. Patient is not currently taking opioid prescriptions. Functional ability and status Nutritional status Physical activity Advanced directives List of other physicians Hospitalizations, surgeries, and ER visits in previous 12 months Vitals Screenings to include cognitive, depression, and falls Referrals and appointments  In addition, I have reviewed and discussed with patient certain preventive protocols, quality metrics, and best practice recommendations. A written personalized care plan for preventive services as well as general preventive health recommendations were provided to patient.   Verdie CHRISTELLA Saba, CMA   06/05/2024   After Visit Summary: (In Person-Declined) Patient declined AVS at this time.  Notes: Nothing significant to report at this time.

## 2024-06-05 NOTE — Telephone Encounter (Signed)
Recommend Jardiance 10 mg daily.

## 2024-06-05 NOTE — Progress Notes (Signed)
 Gerald Fisher 73 y.o.   Chief Complaint  Patient presents with   Annual Exam    HISTORY OF PRESENT ILLNESS: This is a 73 y.o. male here for annual exam and follow-up on chronic medical conditions including hypertension, prediabetes, and dyslipidemia. Elevated blood pressure readings at home No other complaints or medical concerns today.  HPI   Prior to Admission medications   Medication Sig Start Date End Date Taking? Authorizing Provider  aspirin EC 81 MG tablet Take 81 mg by mouth daily.    [provider]  colchicine  0.6 MG tablet Take 1 tablet (0.6 mg total) by mouth daily. 09/20/23   Annarae Macnair Jose, MD  lisinopril  (ZESTRIL ) 40 MG tablet TAKE 1 TABLET BY MOUTH EVERY DAY 03/14/24   Purcell Emil Schanz, MD  rosuvastatin  (CRESTOR ) 20 MG tablet Take 1 tablet (20 mg total) by mouth daily. 09/20/23   Purcell Emil Schanz, MD    No Known Allergies  Patient Active Problem List   Diagnosis Date Noted   Dyslipidemia 01/06/2023   Prediabetes 08/29/2018   History of gout 08/29/2018   Essential hypertension 05/24/2015    Past Medical History:  Diagnosis Date   Hypertension     Past Surgical History:  Procedure Laterality Date   FOOT SURGERY      Social History   Socioeconomic History   Marital status: Single    Spouse name: Not on file   Number of children: 2   Years of education: Not on file   Highest education level: Not on file  Occupational History   Not on file  Tobacco Use   Smoking status: Never   Smokeless tobacco: Never  Vaping Use   Vaping status: Never Used  Substance and Sexual Activity   Alcohol use: Yes    Alcohol/week: 1.0 standard drink of alcohol    Types: 1 Glasses of wine per week    Comment: socially   Drug use: No   Sexual activity: Yes    Birth control/protection: Condom  Other Topics Concern   Not on file  Social History Narrative   Not on file   Social Drivers of Health   Financial Resource Strain: Low Risk   (05/10/2023)   Overall Financial Resource Strain (CARDIA)    Difficulty of Paying Living Expenses: Not hard at all  Food Insecurity: No Food Insecurity (06/05/2024)   Hunger Vital Sign    Worried About Running Out of Food in the Last Year: Never true    Ran Out of Food in the Last Year: Never true  Transportation Needs: No Transportation Needs (06/05/2024)   PRAPARE - Administrator, Civil Service (Medical): No    Lack of Transportation (Non-Medical): No  Physical Activity: Sufficiently Active (06/05/2024)   Exercise Vital Sign    Days of Exercise per Week: 5 days    Minutes of Exercise per Session: 60 min  Stress: No Stress Concern Present (06/05/2024)   [employer] of Occupational Health - Occupational Stress Questionnaire    Feeling of Stress: Not at all  Social Connections: Moderately Integrated (06/05/2024)   Social Connection and Isolation Panel    Frequency of Communication with Friends and Family: More than three times a week    Frequency of Social Gatherings with Friends and Family: More than three times a week    Attends Religious Services: Never    Database Administrator or Organizations: Yes    Attends Engineer, Structural: More than 4 times  per year    Marital Status: Living with partner  Intimate Partner Violence: Not At Risk (06/05/2024)   Humiliation, Afraid, Rape, and Kick questionnaire    Fear of Current or Ex-Partner: No    Emotionally Abused: No    Physically Abused: No    Sexually Abused: No    Family History  Problem Relation Age of Onset   Heart disease Mother    Hypertension Father      Review of Systems  Constitutional: Negative.  Negative for chills and fever.  HENT: Negative.  Negative for congestion and sore throat.   Respiratory: Negative.  Negative for cough and shortness of breath.   Cardiovascular: Negative.  Negative for chest pain and palpitations.  Gastrointestinal:  Negative for abdominal pain, diarrhea, nausea and  vomiting.  Genitourinary: Negative.  Negative for dysuria and hematuria.  Skin: Negative.  Negative for rash.  Neurological: Negative.  Negative for dizziness and headaches.    Today's Vitals   06/05/24 0843  BP: (!) 180/80  Pulse: 66  Temp: 98 F (36.7 C)  TempSrc: Oral  SpO2: 98%  Weight: 195 lb (88.5 kg)  Height: 5' 9.5 (1.765 m)   Body mass index is 28.38 kg/m.   Physical Exam Vitals reviewed.  Constitutional:      Appearance: Normal appearance.  HENT:     Head: Normocephalic.     Mouth/Throat:     Mouth: Mucous membranes are moist.     Pharynx: Oropharynx is clear.  Eyes:     Extraocular Movements: Extraocular movements intact.     Pupils: Pupils are equal, round, and reactive to light.  Cardiovascular:     Rate and Rhythm: Normal rate and regular rhythm.     Pulses: Normal pulses.     Heart sounds: Normal heart sounds.  Pulmonary:     Effort: Pulmonary effort is normal.     Breath sounds: Normal breath sounds.  Abdominal:     Palpations: Abdomen is soft.     Tenderness: There is no abdominal tenderness.  Musculoskeletal:     Cervical back: No tenderness.  Lymphadenopathy:     Cervical: No cervical adenopathy.  Skin:    General: Skin is warm and dry.     Capillary Refill: Capillary refill takes less than 2 seconds.  Neurological:     General: No focal deficit present.     Mental Status: He is alert and oriented to person, place, and time.  Psychiatric:        Mood and Affect: Mood normal.        Behavior: Behavior normal.      ASSESSMENT & PLAN: Problem List Items Addressed This Visit       Cardiovascular and Mediastinum   Essential hypertension   BP Readings from Last 3 Encounters:  06/05/24 (!) 180/80  06/05/24 (!) 182/88  09/20/23 (!) 152/90  Elevated blood pressure readings in the office and at home Recommend to change lisinopril  to ARB losartan or valsartan Cardiovascular risks associated with hypertension discussed Dietary  approaches to stop hypertension discussed Blood work done today Benefits of exercise discussed Follow-up in 6 months       Relevant Medications   valsartan (DIOVAN) 160 MG tablet   Other Relevant Orders   Comprehensive metabolic panel with GFR   CBC with Differential/Platelet     Other   Prediabetes   Hemoglobin A1c done today Diet and nutrition discussed Advised to decrease amount of daily carbohydrate intake and daily calories and increase amount  of plant-based protein in his diet      Relevant Orders   Comprehensive metabolic panel with GFR   Hemoglobin A1c   History of gout   No recent flareups Continues daily colchicine  0.6 mg      Relevant Medications   colchicine  0.6 MG tablet   Dyslipidemia   Diet and nutrition discussed Lipid profile done today Continue rosuvastatin  20 mg daily      Relevant Orders   Comprehensive metabolic panel with GFR   Lipid panel   Other Visit Diagnoses       Encounter for general adult medical examination with abnormal findings    -  Primary   Relevant Orders   Comprehensive metabolic panel with GFR   CBC with Differential/Platelet   Hemoglobin A1c   Lipid panel   PSA     Screening for prostate cancer       Relevant Orders   PSA     Screening for deficiency anemia       Relevant Orders   CBC with Differential/Platelet     Screening for endocrine, metabolic and immunity disorder       Relevant Orders   Comprehensive metabolic panel with GFR   Hemoglobin A1c      Modifiable risk factors discussed with patient. Anticipatory guidance according to age provided. The following topics were also discussed: Social Determinants of Health Smoking.  Non-smoker Diet and nutrition Benefits of exercise Cancer screening and review of most recent colonoscopy report from 2018 Vaccinations review and recommendations Cardiovascular risk assessment The 10-year ASCVD risk score (Arnett DK, et al., 2019) is: 32.5%   Values used to  calculate the score:     Age: 85 years     Clincally relevant sex: Male     Is Non-Hispanic African American: Yes     Diabetic: No     Tobacco smoker: No     Systolic Blood Pressure: 180 mmHg     Is BP treated: Yes     HDL Cholesterol: 97.5 mg/dL     Total Cholesterol: 227 mg/dL Review multiple chronic medical conditions and their management Review of all medications and changes made Cardiovascular risks associated with uncontrolled hypertension Mental health including depression and anxiety Fall and accident prevention  Patient Instructions  Health Maintenance, Male Adopting a healthy lifestyle and getting preventive care are important in promoting health and wellness. Ask your health care provider about: The right schedule for you to have regular tests and exams. Things you can do on your own to prevent diseases and keep yourself healthy. What should I know about diet, weight, and exercise? Eat a healthy diet  Eat a diet that includes plenty of vegetables, fruits, low-fat dairy products, and lean protein. Do not eat a lot of foods that are high in solid fats, added sugars, or sodium. Maintain a healthy weight Body mass index (BMI) is a measurement that can be used to identify possible weight problems. It estimates body fat based on height and weight. Your health care provider can help determine your BMI and help you achieve or maintain a healthy weight. Get regular exercise Get regular exercise. This is one of the most important things you can do for your health. Most adults should: Exercise for at least 150 minutes each week. The exercise should increase your heart rate and make you sweat (moderate-intensity exercise). Do strengthening exercises at least twice a week. This is in addition to the moderate-intensity exercise. Spend less time sitting. Even light physical  activity can be beneficial. Watch cholesterol and blood lipids Have your blood tested for lipids and cholesterol  at 75 years of age, then have this test every 5 years. You may need to have your cholesterol levels checked more often if: Your lipid or cholesterol levels are high. You are older than 75 years of age. You are at high risk for heart disease. What should I know about cancer screening? Many types of cancers can be detected early and may often be prevented. Depending on your health history and family history, you may need to have cancer screening at various ages. This may include screening for: Colorectal cancer. Prostate cancer. Skin cancer. Lung cancer. What should I know about heart disease, diabetes, and high blood pressure? Blood pressure and heart disease High blood pressure causes heart disease and increases the risk of stroke. This is more likely to develop in people who have high blood pressure readings or are overweight. Talk with your health care provider about your target blood pressure readings. Have your blood pressure checked: Every 3-5 years if you are 37-45 years of age. Every year if you are 73 years old or older. If you are between the ages of 82 and 18 and are a current or former smoker, ask your health care provider if you should have a one-time screening for abdominal aortic aneurysm (AAA). Diabetes Have regular diabetes screenings. This checks your fasting blood sugar level. Have the screening done: Once every three years after age 67 if you are at a normal weight and have a low risk for diabetes. More often and at a younger age if you are overweight or have a high risk for diabetes. What should I know about preventing infection? Hepatitis B If you have a higher risk for hepatitis B, you should be screened for this virus. Talk with your health care provider to find out if you are at risk for hepatitis B infection. Hepatitis C Blood testing is recommended for: Everyone born from [birth year] through 1967. Anyone with known risk factors for hepatitis C. Sexually transmitted  infections (STIs) You should be screened each year for STIs, including gonorrhea and chlamydia, if: You are sexually active and are younger than 75 years of age. You are older than 75 years of age and your health care provider tells you that you are at risk for this type of infection. Your sexual activity has changed since you were last screened, and you are at increased risk for chlamydia or gonorrhea. Ask your health care provider if you are at risk. Ask your health care provider about whether you are at high risk for HIV. Your health care provider may recommend a prescription medicine to help prevent HIV infection. If you choose to take medicine to prevent HIV, you should first get tested for HIV. You should then be tested every 3 months for as long as you are taking the medicine. Follow these instructions at home: Alcohol use Do not drink alcohol if your health care provider tells you not to drink. If you drink alcohol: Limit how much you have to 0-2 drinks a day. Know how much alcohol is in your drink. In the [place] one drink equals one 12 oz bottle of beer (355 mL), one 5 oz glass of wine (148 mL), or one 1 oz glass of hard liquor (44 mL). Lifestyle Do not use any products that contain nicotine or tobacco. These products include cigarettes, chewing tobacco, and vaping devices, such as e-cigarettes. If you need help  quitting, ask your health care provider. Do not use street drugs. Do not share needles. Ask your health care provider for help if you need support or information about quitting drugs. General instructions Schedule regular health, dental, and eye exams. Stay current with your vaccines. Tell your health care provider if: You often feel depressed. You have ever been abused or do not feel safe at home. Summary Adopting a healthy lifestyle and getting preventive care are important in promoting health and wellness. Follow your health care provider's instructions about healthy  diet, exercising, and getting tested or screened for diseases. Follow your health care provider's instructions on monitoring your cholesterol and blood pressure. This information is not intended to replace advice given to you by your health care provider. Make sure you discuss any questions you have with your health care provider. Document Revised: 12/09/2020 Document Reviewed: 12/09/2020 Elsevier Patient Education  2024 Elsevier Inc.       Emil Schaumann, MD [facility]

## 2024-06-05 NOTE — Telephone Encounter (Signed)
 Sent in medication and called patient and informed him that this was sent in

## 2024-06-05 NOTE — Addendum Note (Signed)
 Addended by: ROSALVA LEX RAMAN on: 06/05/2024 04:39 PM   Modules accepted: Orders

## 2024-06-05 NOTE — Assessment & Plan Note (Signed)
 BP Readings from Last 3 Encounters:  06/05/24 (!) 180/80  06/05/24 (!) 182/88  09/20/23 (!) 152/90  Elevated blood pressure readings in the office and at home Recommend to change lisinopril  to ARB losartan or valsartan Cardiovascular risks associated with hypertension discussed Dietary approaches to stop hypertension discussed Blood work done today Benefits of exercise discussed Follow-up in 6 months

## 2024-06-05 NOTE — Assessment & Plan Note (Signed)
 No recent flareups Continues daily colchicine  0.6 mg

## 2024-06-05 NOTE — Telephone Encounter (Signed)
 Copied from CRM [id number]. Topic: Clinical - Medication Question >> Jun 05, 2024  3:31 PM Shereese L wrote: Reason for CRM: Patient is requesting an alternative to dapagliflozin propanediol (FARXIGA) 10 MG TABS tablet fue to the copay being almost $900 and he stated he needs something cheaper

## 2024-06-05 NOTE — Assessment & Plan Note (Signed)
Diet and nutrition discussed. Lipid profile done today. Continue rosuvastatin 20 mg daily.  

## 2024-06-08 ENCOUNTER — Telehealth: Payer: Self-pay | Admitting: *Deleted

## 2024-06-08 NOTE — Progress Notes (Signed)
 Care Guide Pharmacy Note  06/08/2024 Name: Gerald Fisher MRN: [id number] DOB: February 09, 1951  Referred By: Purcell Emil Schanz, MD Reason for referral: Call Attempt #1 and Complex Care Management (Outreach to schedule referral with pharmacist )   Gerald Fisher is a 73 y.o. year old male who is a primary care patient of Sagardia, Miguel Jose, MD.  Gerald Fisher was referred to the pharmacist for assistance related to: CKD Stage 3  Successful contact was made with the patient to discuss pharmacy services including being ready for the pharmacist to call at least 5 minutes before the scheduled appointment time and to have medication bottles and any blood pressure readings ready for review. The patient agreed to meet with the pharmacist via telephone visit on 06/14/2024  Gerald Fisher, CMA [place]  [facility], [facility] Guide Direct Dial: [phone]  Fax: [phone] Website: [email or web address]

## 2024-06-14 ENCOUNTER — Other Ambulatory Visit: Admitting: Pharmacist

## 2024-06-14 DIAGNOSIS — E785 Hyperlipidemia, unspecified: Secondary | ICD-10-CM

## 2024-06-14 DIAGNOSIS — N1831 Chronic kidney disease, stage 3a: Secondary | ICD-10-CM

## 2024-06-14 DIAGNOSIS — I10 Essential (primary) hypertension: Secondary | ICD-10-CM

## 2024-06-14 MED ORDER — ROSUVASTATIN CALCIUM 40 MG PO TABS: 40.0000 mg | ORAL_TABLET | Freq: Every day | ORAL | 3 refills | Status: AC

## 2024-06-14 NOTE — Progress Notes (Signed)
 06/14/2024 Name: Gerald Fisher MRN: [id number] DOB: April 11, 1951  Chief Complaint  Patient presents with   Hypertension   Medication Management   Hyperlipidemia   Chronic Kidney Disease    ROYSTON BEKELE is a 73 y.o. year old male who presented for a telephone visit.   They were referred to the pharmacist by their PCP for assistance in managing hypertension and hyperlipidemia/cardiovascular risk reduction, CKD.  Subjective:  Care Team: Primary Care Provider: Purcell Emil Schanz, MD ; Next Scheduled Visit: 06/12/25   Medication Access/Adherence  Current Pharmacy:  [id number] - [place], [place] - [address] [place] [address] Phone: [phone] Fax: [phone]   Patient reports affordability concerns with their medications: No  Patient reports access/transportation concerns to their pharmacy: No  Patient reports adherence concerns with their medications:  No     Hypertension:  Current medications: valsartan 160 mg daily Medications previously tried: lisinopril  40 mg daily (recently d/c and switched to valsartan)  Patient has a validated, automated, upper arm home BP cuff Current blood pressure  readings: 11/3 171/90, last night 138/75, this morning 158/84 Pt notes he has been checking BP twice a day since 11/3 appt. He feels BP has been gradually coming down.  Patient denies hypotensive s/sx including dizziness, lightheadedness.  Patient denies hypertensive symptoms including headache, chest pain, shortness of breath  Current physical activity: generally is an active person, does not sit a lot. Has started walking 1 mile every morning.   Hyperlipidemia/ASCVD Risk Reduction  Current lipid lowering medications: atorvastatin 20 mg daily Medications tried in the past:   Antiplatelet regimen: aspirin 81 mg daily   PREVENT Risk Score:   [email or web address] - 10 year risk of CVD: 21.6% - 10 year risk of ASCVD: 12.1% - 10 year risk of HF: 16.9%  CKD: Current medication: Jardiance 10 mg daily (started ~1 week ago)  Doreen was $33 with insurance. Jardiance was $145 for 90 DS and he notes this is reasonable for him.   Objective:  Lab Results  Component Value Date   HGBA1C 5.7 06/05/2024    Lab Results  Component Value Date   CREATININE 1.39 06/05/2024   BUN 19 06/05/2024   NA 139 06/05/2024   K 4.3 06/05/2024   CL 103 06/05/2024   CO2 28 06/05/2024    Lab Results  Component Value Date   CHOL 214 (H) 06/05/2024   HDL 84.90 06/05/2024   LDLCALC 102 (H) 06/05/2024   TRIG 134.0 06/05/2024   CHOLHDL 3 06/05/2024    Medications Reviewed Today     Reviewed by Merceda Lela SAUNDERS, RPH (Pharmacist) on 06/14/24 at 1309  Med List Status: <None>   Medication Order Taking? Sig Documenting Provider Last Dose Status Informant  aspirin EC 81 MG tablet [id number] Yes Take 81 mg by mouth daily. [provider]  Active   colchicine  0.6 MG tablet [id number] Yes Take 1 tablet (0.6 mg total) by mouth daily. Purcell Emil Schanz, MD  Active     Discontinued 06/14/24 [phone] (Not covered by the pt's insurance)   empagliflozin (JARDIANCE) 10 MG TABS tablet [id number] Yes Take 1 tablet (10 mg total) by mouth daily. Sagardia, Miguel Jose, MD  Active   rosuvastatin  (CRESTOR ) 20 MG tablet [id number] Yes Take 1 tablet (20 mg total) by mouth daily. Sagardia, Miguel Jose, MD  Active   valsartan (DIOVAN) 160 MG tablet [id number] Yes Take 1 tablet (160 mg total) by mouth daily. Purcell Emil Schanz, MD  Active  Assessment/Plan:   Hypertension: - Pt with long standing history of uncontrolled BP -Currently uncontrolled, BP goal <130/80 - Reviewed long term cardiovascular and renal outcomes of uncontrolled blood pressure -  Reviewed appropriate blood pressure monitoring technique and reviewed goal blood pressure. Recommended to check home blood pressure and heart rate  - Recommend to continue valsartan 160 mg daily - Pt to come next week for BMP. If remains stable, increase valsartan to 320 mg daily. May need to add second agent    Hyperlipidemia/ASCVD Risk Reduction: - Currently uncontrolled. LDL goal <100. - Reviewed long term complications of uncontrolled cholesterol - Reviewed lifestyle recommendations including continue walking/reduced sedentary lifestyle - Recommend to increase atorvastatin to 40 mg daily   CKD: - Opportunity to optimize therapy. Want to increase ARB to high tolerable dose. - Recommend getting UACR yearly - Explained main priority for protecting kidney function is to get BP under control w/ BP goal <130/80  Follow Up Plan: 11/18 or 19 lab  Darrelyn Drum, PharmD, BCPS, CPP Clinical Pharmacist Practitioner [facility] at [facility] [phone]

## 2024-06-14 NOTE — Patient Instructions (Signed)
 It was a pleasure speaking with you today!  Increase your rosuvastatin  to 40 mg daily. You can take 2 tablets of your 20 mg rosuvastatin  until you run out. Your new prescription will be for rosuvastatin  40 mg tablets to take 1 tablet daily.  Come for walk-in lab work at [facility] first floor lab on 11/18 or 11/19. No appointment is needed and you can eat before getting the labs done. I will call with the results.  Continue monitoring your blood pressure at home.  Feel free to call with any questions or concerns!  Darrelyn Drum, PharmD, BCPS, CPP Clinical Pharmacist Practitioner [facility] [facility] at [facility] [phone]

## 2024-06-20 ENCOUNTER — Ambulatory Visit: Payer: Self-pay | Admitting: Pharmacist

## 2024-06-20 ENCOUNTER — Other Ambulatory Visit

## 2024-06-20 DIAGNOSIS — N1831 Chronic kidney disease, stage 3a: Secondary | ICD-10-CM | POA: Diagnosis not present

## 2024-06-20 DIAGNOSIS — L821 Other seborrheic keratosis: Secondary | ICD-10-CM | POA: Diagnosis not present

## 2024-06-20 DIAGNOSIS — I10 Essential (primary) hypertension: Secondary | ICD-10-CM | POA: Diagnosis not present

## 2024-06-20 LAB — BASIC METABOLIC PANEL WITH GFR
BUN: 18 mg/dL (ref 6–23)
CO2: 26 meq/L (ref 19–32)
Calcium: 9.3 mg/dL (ref 8.4–10.5)
Chloride: 108 meq/L (ref 96–112)
Creatinine, Ser: 1.48 mg/dL (ref 0.40–1.50)
GFR: 46.19 mL/min — ABNORMAL LOW (ref >60.00)
Glucose, Bld: 102 mg/dL — ABNORMAL HIGH (ref 70–99)
Potassium: 4.1 meq/L (ref 3.5–5.1)
Sodium: 143 meq/L (ref 135–145)

## 2024-06-21 ENCOUNTER — Telehealth: Payer: Self-pay | Admitting: Pharmacist

## 2024-06-21 NOTE — Telephone Encounter (Signed)
 Contacted patient to discuss lab results from yesterday. Scr and K were stable after switching to valsartan. Did want to see if his BP is still elevated at home and increase his valsartan if needed based on readings. Will send a MyChart message and follow up upon my return (out of office until 11/24).  Darrelyn Drum, PharmD, BCPS, CPP Clinical Pharmacist Practitioner [place] at [facility] [phone]

## 2024-07-31 ENCOUNTER — Encounter: Payer: Self-pay | Admitting: Emergency Medicine

## 2024-07-31 ENCOUNTER — Ambulatory Visit: Admitting: Emergency Medicine

## 2024-07-31 VITALS — BP 136/86 | HR 72 | Temp 98.0°F | Ht 69.5 in | Wt 188.0 lb

## 2024-07-31 DIAGNOSIS — N1831 Chronic kidney disease, stage 3a: Secondary | ICD-10-CM | POA: Insufficient documentation

## 2024-07-31 DIAGNOSIS — S86911A Strain of unspecified muscle(s) and tendon(s) at lower leg level, right leg, initial encounter: Secondary | ICD-10-CM | POA: Diagnosis not present

## 2024-07-31 DIAGNOSIS — I10 Essential (primary) hypertension: Secondary | ICD-10-CM

## 2024-07-31 DIAGNOSIS — E785 Hyperlipidemia, unspecified: Secondary | ICD-10-CM | POA: Diagnosis not present

## 2024-07-31 MED ORDER — VALSARTAN-HYDROCHLOROTHIAZIDE 160-12.5 MG PO TABS: 1.0000 | ORAL_TABLET | Freq: Every day | ORAL | 1 refills | Status: AC

## 2024-07-31 NOTE — Assessment & Plan Note (Addendum)
 Advised to stay well-hydrated and avoid NSAIDs Continue Jardiance 10 mg daily

## 2024-07-31 NOTE — Progress Notes (Signed)
 Gerald Fisher 73 y.o.   Chief Complaint  Patient presents with   Pain    Right side of his leg and pt stats that this has been going on for several weeks     HISTORY OF PRESENT ILLNESS: This is a 73 y.o. male complaining of pain to back of right thigh for several weeks Denies injury.  Concerned about blood clot. No other associated symptoms Also history of hypertension here for follow-up.  Elevated blood pressure readings at home. No other complaints or medical concerns today.  HPI   Prior to Admission medications  Medication Sig Start Date End Date Taking? Authorizing Provider  aspirin EC 81 MG tablet Take 81 mg by mouth daily.   Yes [provider]  colchicine  0.6 MG tablet Take 1 tablet (0.6 mg total) by mouth daily. 06/05/24  Yes Luv Mish, Emil Schanz, MD  empagliflozin  (JARDIANCE ) 10 MG TABS tablet Take 1 tablet (10 mg total) by mouth daily. 06/05/24  Yes Kendarius Vigen, Emil Schanz, MD  rosuvastatin  (CRESTOR ) 40 MG tablet Take 1 tablet (40 mg total) by mouth daily. 06/14/24  Yes Joci Dress, Emil Schanz, MD  valsartan -hydrochlorothiazide  (DIOVAN -HCT) 160-12.5 MG tablet Take 1 tablet by mouth daily. 07/31/24  Yes Purcell Emil Schanz, MD    Allergies[1]  Patient Active Problem List   Diagnosis Date Noted   Dyslipidemia 01/06/2023   Prediabetes 08/29/2018   History of gout 08/29/2018   Essential hypertension 05/24/2015    Past Medical History:  Diagnosis Date   Hypertension     Past Surgical History:  Procedure Laterality Date   FOOT SURGERY      Social History   Socioeconomic History   Marital status: Single    Spouse name: Not on file   Number of children: 2   Years of education: Not on file   Highest education level: Not on file  Occupational History   Not on file  Tobacco Use   Smoking status: Never   Smokeless tobacco: Never  Vaping Use   Vaping status: Never Used  Substance and Sexual Activity   Alcohol use: Yes    Alcohol/week: 1.0 standard  drink of alcohol    Types: 1 Glasses of wine per week    Comment: socially   Drug use: No   Sexual activity: Yes    Birth control/protection: Condom  Other Topics Concern   Not on file  Social History Narrative   Not on file   Social Drivers of Health   Tobacco Use: Low Risk (07/31/2024)   Patient History    Smoking Tobacco Use: Never    Smokeless Tobacco Use: Never    Passive Exposure: Not on file  Financial Resource Strain: Low Risk (05/10/2023)   Overall Financial Resource Strain (CARDIA)    Difficulty of Paying Living Expenses: Not hard at all  Food Insecurity: No Food Insecurity (06/05/2024)   Epic    Worried About Programme Researcher, Broadcasting/film/video in the Last Year: Never true    Ran Out of Food in the Last Year: Never true  Transportation Needs: No Transportation Needs (06/05/2024)   Epic    Lack of Transportation (Medical): No    Lack of Transportation (Non-Medical): No  Physical Activity: Sufficiently Active (06/05/2024)   Exercise Vital Sign    Days of Exercise per Week: 5 days    Minutes of Exercise per Session: 60 min  Stress: No Stress Concern Present (06/05/2024)   [employer] of Occupational Health - Occupational Stress Questionnaire  Feeling of Stress: Not at all  Social Connections: Socially Integrated (06/05/2024)   Social Connection and Isolation Panel    Frequency of Communication with Friends and Family: More than three times a week    Frequency of Social Gatherings with Friends and Family: Not on file    Attends Religious Services: More than 4 times per year    Active Member of Clubs or Organizations: No    Attends Engineer, Structural: More than 4 times per year    Marital Status: Living with partner  Intimate Partner Violence: Not At Risk (06/05/2024)   Epic    Fear of Current or Ex-Partner: No    Emotionally Abused: No    Physically Abused: No    Sexually Abused: No  Depression (PHQ2-9): Low Risk (07/31/2024)   Depression (PHQ2-9)    PHQ-2  Score: 0  Alcohol Screen: Low Risk (05/10/2023)   Alcohol Screen    Last Alcohol Screening Score (AUDIT): 3  Housing: Low Risk (06/05/2024)   Epic    Unable to Pay for Housing in the Last Year: No    Number of Times Moved in the Last Year: 0    Homeless in the Last Year: No  Utilities: Not At Risk (06/05/2024)   Epic    Threatened with loss of utilities: No  Health Literacy: Adequate Health Literacy (06/05/2024)   B1300 Health Literacy    Frequency of need for help with medical instructions: Never    Family History  Problem Relation Age of Onset   Heart disease Mother    Hypertension Father      Review of Systems  Constitutional: Negative.  Negative for chills and fever.  HENT: Negative.  Negative for congestion and sore throat.   Respiratory: Negative.  Negative for cough and shortness of breath.   Cardiovascular: Negative.  Negative for chest pain and palpitations.  Gastrointestinal:  Negative for abdominal pain, diarrhea, nausea and vomiting.  Genitourinary: Negative.  Negative for dysuria and hematuria.  Skin: Negative.  Negative for rash.  Neurological: Negative.  Negative for dizziness and headaches.  All other systems reviewed and are negative.   Vitals:   07/31/24 1539  BP: 136/86  Pulse: 72  Temp: 98 F (36.7 C)  SpO2: 98%    Physical Exam Vitals reviewed.  Constitutional:      Appearance: Normal appearance.  HENT:     Head: Normocephalic.  Eyes:     Extraocular Movements: Extraocular movements intact.     Pupils: Pupils are equal, round, and reactive to light.  Cardiovascular:     Rate and Rhythm: Normal rate and regular rhythm.     Pulses: Normal pulses.     Heart sounds: Normal heart sounds.  Pulmonary:     Effort: Pulmonary effort is normal.     Breath sounds: Normal breath sounds.  Abdominal:     Palpations: Abdomen is soft.     Tenderness: There is no abdominal tenderness.  Musculoskeletal:     Cervical back: No tenderness.     Comments:  Right lower extremity: No tenderness.  No findings of DVT.  Normal exam.  Lymphadenopathy:     Cervical: No cervical adenopathy.  Skin:    General: Skin is warm and dry.     Capillary Refill: Capillary refill takes less than 2 seconds.  Neurological:     General: No focal deficit present.     Mental Status: He is alert and oriented to person, place, and time.  Psychiatric:  Mood and Affect: Mood normal.        Behavior: Behavior normal.      ASSESSMENT & PLAN: Problem List Items Addressed This Visit       Cardiovascular and Mediastinum   Essential hypertension - Primary   Elevated blood pressure readings at home with average systolics in the 150s Recommend to change antihypertensive to valsartan  HCT 160-12.5 mg daily Advised to continue monitoring blood pressure readings at home and contact the office if numbers persistently still abnormal Diet and nutrition discussed      Relevant Medications   valsartan -hydrochlorothiazide  (DIOVAN -HCT) 160-12.5 MG tablet     Musculoskeletal and Integument   Muscle strain of right lower extremity   Clinically stable.  Musculoskeletal pain Pain management discussed May take Tylenol   as needed for pain Recommend to stretch before and after walking Heat pad recommended        Genitourinary   Stage 3a chronic kidney disease (HCC)   Advised to stay well-hydrated and avoid NSAIDs Continue Jardiance  10 mg daily        Other   Dyslipidemia   Diet and nutrition discussed Continue rosuvastatin  20 mg daily      Patient Instructions  Hypertension, Adult High blood pressure (hypertension) is when the force of blood pumping through the arteries is too strong. The arteries are the blood vessels that carry blood from the heart throughout the body. Hypertension forces the heart to work harder to pump blood and may cause arteries to become narrow or stiff. Untreated or uncontrolled hypertension can lead to a heart attack, heart failure,  a stroke, kidney disease, and other problems. A blood pressure reading consists of a higher number over a lower number. Ideally, your blood pressure should be below 120/80. The first (top) number is called the systolic pressure. It is a measure of the pressure in your arteries as your heart beats. The second (bottom) number is called the diastolic pressure. It is a measure of the pressure in your arteries as the heart relaxes. What are the causes? The exact cause of this condition is not known. There are some conditions that result in high blood pressure. What increases the risk? Certain factors may make you more likely to develop high blood pressure. Some of these risk factors are under your control, including: Smoking. Not getting enough exercise or physical activity. Being overweight. Having too much fat, sugar, calories, or salt (sodium) in your diet. Drinking too much alcohol. Other risk factors include: Having a personal history of heart disease, diabetes, high cholesterol, or kidney disease. Stress. Having a family history of high blood pressure and high cholesterol. Having obstructive sleep apnea. Age. The risk increases with age. What are the signs or symptoms? High blood pressure may not cause symptoms. Very high blood pressure (hypertensive crisis) may cause: Headache. Fast or irregular heartbeats (palpitations). Shortness of breath. Nosebleed. Nausea and vomiting. Vision changes. Severe chest pain, dizziness, and seizures. How is this diagnosed? This condition is diagnosed by measuring your blood pressure while you are seated, with your arm resting on a flat surface, your legs uncrossed, and your feet flat on the floor. The cuff of the blood pressure monitor will be placed directly against the skin of your upper arm at the level of your heart. Blood pressure should be measured at least twice using the same arm. Certain conditions can cause a difference in blood pressure  between your right and left arms. If you have a high blood pressure reading during one  visit or you have normal blood pressure with other risk factors, you may be asked to: Return on a different day to have your blood pressure checked again. Monitor your blood pressure at home for 1 week or longer. If you are diagnosed with hypertension, you may have other blood or imaging tests to help your health care provider understand your overall risk for other conditions. How is this treated? This condition is treated by making healthy lifestyle changes, such as eating healthy foods, exercising more, and reducing your alcohol intake. You may be referred for counseling on a healthy diet and physical activity. Your health care provider may prescribe medicine if lifestyle changes are not enough to get your blood pressure under control and if: Your systolic blood pressure is above 130. Your diastolic blood pressure is above 80. Your personal target blood pressure may vary depending on your medical conditions, your age, and other factors. Follow these instructions at home: Eating and drinking  Eat a diet that is high in fiber and potassium, and low in sodium, added sugar, and fat. An example of this eating plan is called the DASH diet. DASH stands for Dietary Approaches to Stop Hypertension. To eat this way: Eat plenty of fresh fruits and vegetables. Try to fill one half of your plate at each meal with fruits and vegetables. Eat whole grains, such as whole-wheat pasta, brown rice, or whole-grain bread. Fill about one fourth of your plate with whole grains. Eat or drink low-fat dairy products, such as skim milk or low-fat yogurt. Avoid fatty cuts of meat, processed or cured meats, and poultry with skin. Fill about one fourth of your plate with lean proteins, such as fish, chicken without skin, beans, eggs, or tofu. Avoid pre-made and processed foods. These tend to be higher in sodium, added sugar, and  fat. Reduce your daily sodium intake. Many people with hypertension should eat less than 1,500 mg of sodium a day. Do not drink alcohol if: Your health care provider tells you not to drink. You are pregnant, may be pregnant, or are planning to become pregnant. If you drink alcohol: Limit how much you have to: 0-1 drink a day for women. 0-2 drinks a day for men. Know how much alcohol is in your drink. In the [place] one drink equals one 12 oz bottle of beer (355 mL), one 5 oz glass of wine (148 mL), or one 1 oz glass of hard liquor (44 mL). Lifestyle  Work with your health care provider to maintain a healthy body weight or to lose weight. Ask what an ideal weight is for you. Get at least 30 minutes of exercise that causes your heart to beat faster (aerobic exercise) most days of the week. Activities may include walking, swimming, or biking. Include exercise to strengthen your muscles (resistance exercise), such as Pilates or lifting weights, as part of your weekly exercise routine. Try to do these types of exercises for 30 minutes at least 3 days a week. Do not use any products that contain nicotine or tobacco. These products include cigarettes, chewing tobacco, and vaping devices, such as e-cigarettes. If you need help quitting, ask your health care provider. Monitor your blood pressure at home as told by your health care provider. Keep all follow-up visits. This is important. Medicines Take over-the-counter and prescription medicines only as told by your health care provider. Follow directions carefully. Blood pressure medicines must be taken as prescribed. Do not skip doses of blood pressure medicine. Doing this puts  you at risk for problems and can make the medicine less effective. Ask your health care provider about side effects or reactions to medicines that you should watch for. Contact a health care provider if you: Think you are having a reaction to a medicine you are taking. Have  headaches that keep coming back (recurring). Feel dizzy. Have swelling in your ankles. Have trouble with your vision. Get help right away if you: Develop a severe headache or confusion. Have unusual weakness or numbness. Feel faint. Have severe pain in your chest or abdomen. Vomit repeatedly. Have trouble breathing. These symptoms may be an emergency. Get help right away. Call 911. Do not wait to see if the symptoms will go away. Do not drive yourself to the hospital. Summary Hypertension is when the force of blood pumping through your arteries is too strong. If this condition is not controlled, it may put you at risk for serious complications. Your personal target blood pressure may vary depending on your medical conditions, your age, and other factors. For most people, a normal blood pressure is less than 120/80. Hypertension is treated with lifestyle changes, medicines, or a combination of both. Lifestyle changes include losing weight, eating a healthy, low-sodium diet, exercising more, and limiting alcohol. This information is not intended to replace advice given to you by your health care provider. Make sure you discuss any questions you have with your health care provider. Document Revised: 05/27/2021 Document Reviewed: 05/27/2021 Elsevier Patient Education  2024 Elsevier Inc.     Emil Schaumann, MD [facility] at [facility]    [1] No Known Allergies

## 2024-07-31 NOTE — Assessment & Plan Note (Signed)
 Diet and nutrition discussed.  Continue rosuvastatin 20 mg daily.

## 2024-07-31 NOTE — Assessment & Plan Note (Signed)
 Elevated blood pressure readings at home with average systolics in the 150s Recommend to change antihypertensive to valsartan  HCT 160-12.5 mg daily Advised to continue monitoring blood pressure readings at home and contact the office if numbers persistently still abnormal Diet and nutrition discussed

## 2024-07-31 NOTE — Assessment & Plan Note (Addendum)
 Clinically stable.  Musculoskeletal pain Pain management discussed May take Tylenol   as needed for pain Recommend to stretch before and after walking Heat pad recommended

## 2024-07-31 NOTE — Patient Instructions (Signed)
 Hypertension, Adult High blood pressure (hypertension) is when the force of blood pumping through the arteries is too strong. The arteries are the blood vessels that carry blood from the heart throughout the body. Hypertension forces the heart to work harder to pump blood and may cause arteries to become narrow or stiff. Untreated or uncontrolled hypertension can lead to a heart attack, heart failure, a stroke, kidney disease, and other problems. A blood pressure reading consists of a higher number over a lower number. Ideally, your blood pressure should be below 120/80. The first ("top") number is called the systolic pressure. It is a measure of the pressure in your arteries as your heart beats. The second ("bottom") number is called the diastolic pressure. It is a measure of the pressure in your arteries as the heart relaxes. What are the causes? The exact cause of this condition is not known. There are some conditions that result in high blood pressure. What increases the risk? Certain factors may make you more likely to develop high blood pressure. Some of these risk factors are under your control, including: Smoking. Not getting enough exercise or physical activity. Being overweight. Having too much fat, sugar, calories, or salt (sodium) in your diet. Drinking too much alcohol. Other risk factors include: Having a personal history of heart disease, diabetes, high cholesterol, or kidney disease. Stress. Having a family history of high blood pressure and high cholesterol. Having obstructive sleep apnea. Age. The risk increases with age. What are the signs or symptoms? High blood pressure may not cause symptoms. Very high blood pressure (hypertensive crisis) may cause: Headache. Fast or irregular heartbeats (palpitations). Shortness of breath. Nosebleed. Nausea and vomiting. Vision changes. Severe chest pain, dizziness, and seizures. How is this diagnosed? This condition is diagnosed by  measuring your blood pressure while you are seated, with your arm resting on a flat surface, your legs uncrossed, and your feet flat on the floor. The cuff of the blood pressure monitor will be placed directly against the skin of your upper arm at the level of your heart. Blood pressure should be measured at least twice using the same arm. Certain conditions can cause a difference in blood pressure between your right and left arms. If you have a high blood pressure reading during one visit or you have normal blood pressure with other risk factors, you may be asked to: Return on a different day to have your blood pressure checked again. Monitor your blood pressure at home for 1 week or longer. If you are diagnosed with hypertension, you may have other blood or imaging tests to help your health care provider understand your overall risk for other conditions. How is this treated? This condition is treated by making healthy lifestyle changes, such as eating healthy foods, exercising more, and reducing your alcohol intake. You may be referred for counseling on a healthy diet and physical activity. Your health care provider may prescribe medicine if lifestyle changes are not enough to get your blood pressure under control and if: Your systolic blood pressure is above 130. Your diastolic blood pressure is above 80. Your personal target blood pressure may vary depending on your medical conditions, your age, and other factors. Follow these instructions at home: Eating and drinking  Eat a diet that is high in fiber and potassium, and low in sodium, added sugar, and fat. An example of this eating plan is called the DASH diet. DASH stands for Dietary Approaches to Stop Hypertension. To eat this way: Eat  plenty of fresh fruits and vegetables. Try to fill one half of your plate at each meal with fruits and vegetables. Eat whole grains, such as whole-wheat pasta, brown rice, or whole-grain bread. Fill about one  fourth of your plate with whole grains. Eat or drink low-fat dairy products, such as skim milk or low-fat yogurt. Avoid fatty cuts of meat, processed or cured meats, and poultry with skin. Fill about one fourth of your plate with lean proteins, such as fish, chicken without skin, beans, eggs, or tofu. Avoid pre-made and processed foods. These tend to be higher in sodium, added sugar, and fat. Reduce your daily sodium intake. Many people with hypertension should eat less than 1,500 mg of sodium a day. Do not drink alcohol if: Your health care provider tells you not to drink. You are pregnant, may be pregnant, or are planning to become pregnant. If you drink alcohol: Limit how much you have to: 0-1 drink a day for women. 0-2 drinks a day for men. Know how much alcohol is in your drink. In the [place] one drink equals one 12 oz bottle of beer (355 mL), one 5 oz glass of wine (148 mL), or one 1 oz glass of hard liquor (44 mL). Lifestyle  Work with your health care provider to maintain a healthy body weight or to lose weight. Ask what an ideal weight is for you. Get at least 30 minutes of exercise that causes your heart to beat faster (aerobic exercise) most days of the week. Activities may include walking, swimming, or biking. Include exercise to strengthen your muscles (resistance exercise), such as Pilates or lifting weights, as part of your weekly exercise routine. Try to do these types of exercises for 30 minutes at least 3 days a week. Do not use any products that contain nicotine or tobacco. These products include cigarettes, chewing tobacco, and vaping devices, such as e-cigarettes. If you need help quitting, ask your health care provider. Monitor your blood pressure at home as told by your health care provider. Keep all follow-up visits. This is important. Medicines Take over-the-counter and prescription medicines only as told by your health care provider. Follow directions carefully. Blood  pressure medicines must be taken as prescribed. Do not skip doses of blood pressure medicine. Doing this puts you at risk for problems and can make the medicine less effective. Ask your health care provider about side effects or reactions to medicines that you should watch for. Contact a health care provider if you: Think you are having a reaction to a medicine you are taking. Have headaches that keep coming back (recurring). Feel dizzy. Have swelling in your ankles. Have trouble with your vision. Get help right away if you: Develop a severe headache or confusion. Have unusual weakness or numbness. Feel faint. Have severe pain in your chest or abdomen. Vomit repeatedly. Have trouble breathing. These symptoms may be an emergency. Get help right away. Call 911. Do not wait to see if the symptoms will go away. Do not drive yourself to the hospital. Summary Hypertension is when the force of blood pumping through your arteries is too strong. If this condition is not controlled, it may put you at risk for serious complications. Your personal target blood pressure may vary depending on your medical conditions, your age, and other factors. For most people, a normal blood pressure is less than 120/80. Hypertension is treated with lifestyle changes, medicines, or a combination of both. Lifestyle changes include losing weight, eating a healthy,  low-sodium diet, exercising more, and limiting alcohol. This information is not intended to replace advice given to you by your health care provider. Make sure you discuss any questions you have with your health care provider. Document Revised: 05/27/2021 Document Reviewed: 05/27/2021 Elsevier Patient Education  2024 [employer].

## 2025-06-12 ENCOUNTER — Ambulatory Visit

## 2025-06-12 ENCOUNTER — Encounter: Admitting: Emergency Medicine
